# Patient Record
Sex: Female | Born: 1975 | Race: Black or African American | Hispanic: No | Marital: Married | Smoking: Never smoker
Health system: Southern US, Community
[De-identification: ages and names within clinical notes are randomized; demographics above are authoritative.]

## PROBLEM LIST (undated history)

## (undated) DIAGNOSIS — R102 Pelvic and perineal pain: Secondary | ICD-10-CM

## (undated) DIAGNOSIS — D649 Anemia, unspecified: Secondary | ICD-10-CM

## (undated) DIAGNOSIS — E785 Hyperlipidemia, unspecified: Secondary | ICD-10-CM

## (undated) DIAGNOSIS — T7840XA Allergy, unspecified, initial encounter: Secondary | ICD-10-CM

## (undated) HISTORY — DX: Hyperlipidemia, unspecified: E78.5

## (undated) HISTORY — DX: Allergy, unspecified, initial encounter: T78.40XA

## (undated) HISTORY — PX: ABDOMINAL HYSTERECTOMY: SHX81

## (undated) HISTORY — DX: Anemia, unspecified: D64.9

---

## 2011-11-23 ENCOUNTER — Telehealth: Payer: Self-pay | Admitting: Hematology and Oncology

## 2011-11-23 NOTE — Telephone Encounter (Signed)
S/w the pt and she is aware of her new pt appts on 12/14/2011. Date and time was at the pt's request

## 2011-11-30 ENCOUNTER — Ambulatory Visit: Payer: Self-pay | Admitting: Hematology and Oncology

## 2011-11-30 ENCOUNTER — Other Ambulatory Visit: Payer: Self-pay

## 2011-11-30 ENCOUNTER — Other Ambulatory Visit: Payer: Self-pay | Admitting: Lab

## 2011-11-30 ENCOUNTER — Ambulatory Visit: Payer: Self-pay

## 2011-12-07 ENCOUNTER — Ambulatory Visit: Payer: Self-pay | Admitting: Hematology and Oncology

## 2011-12-07 ENCOUNTER — Other Ambulatory Visit: Payer: Self-pay | Admitting: Lab

## 2011-12-10 ENCOUNTER — Encounter: Payer: Self-pay | Admitting: *Deleted

## 2011-12-14 ENCOUNTER — Ambulatory Visit: Payer: Self-pay | Admitting: Hematology and Oncology

## 2011-12-14 ENCOUNTER — Ambulatory Visit: Payer: Self-pay

## 2014-08-29 ENCOUNTER — Emergency Department (HOSPITAL_COMMUNITY)
Admission: EM | Admit: 2014-08-29 | Discharge: 2014-08-29 | Disposition: A | Discharge status: Home / Self Care | Payer: 59 | Attending: Emergency Medicine | Admitting: Emergency Medicine

## 2014-08-29 ENCOUNTER — Encounter (HOSPITAL_COMMUNITY): Payer: Self-pay | Admitting: Emergency Medicine

## 2014-08-29 ENCOUNTER — Emergency Department (HOSPITAL_COMMUNITY): Payer: 59

## 2014-08-29 DIAGNOSIS — R000 Tachycardia, unspecified: Secondary | ICD-10-CM | POA: Diagnosis not present

## 2014-08-29 DIAGNOSIS — O4401 Placenta previa specified as without hemorrhage, first trimester: Secondary | ICD-10-CM | POA: Insufficient documentation

## 2014-08-29 DIAGNOSIS — Z3A13 13 weeks gestation of pregnancy: Secondary | ICD-10-CM | POA: Insufficient documentation

## 2014-08-29 DIAGNOSIS — O26891 Other specified pregnancy related conditions, first trimester: Secondary | ICD-10-CM | POA: Diagnosis not present

## 2014-08-29 DIAGNOSIS — O209 Hemorrhage in early pregnancy, unspecified: Secondary | ICD-10-CM | POA: Diagnosis not present

## 2014-08-29 DIAGNOSIS — O4400 Placenta previa specified as without hemorrhage, unspecified trimester: Secondary | ICD-10-CM

## 2014-08-29 LAB — URINALYSIS, ROUTINE W REFLEX MICROSCOPIC
Bilirubin Urine: NEGATIVE
GLUCOSE, UA: NEGATIVE mg/dL
Hgb urine dipstick: NEGATIVE
Ketones, ur: NEGATIVE mg/dL
LEUKOCYTES UA: NEGATIVE
Nitrite: NEGATIVE
Protein, ur: NEGATIVE mg/dL
Specific Gravity, Urine: 1.017 (ref 1.005–1.030)
UROBILINOGEN UA: 1 mg/dL (ref 0.0–1.0)
pH: 7 (ref 5.0–8.0)

## 2014-08-29 LAB — CBC WITH DIFFERENTIAL/PLATELET
Basophils Absolute: 0 10*3/uL (ref 0.0–0.1)
Basophils Relative: 0 % (ref 0–1)
EOS ABS: 0.1 10*3/uL (ref 0.0–0.7)
EOS PCT: 1 % (ref 0–5)
HCT: 31.3 % — ABNORMAL LOW (ref 36.0–46.0)
Hemoglobin: 10.5 g/dL — ABNORMAL LOW (ref 12.0–15.0)
Lymphocytes Relative: 11 % — ABNORMAL LOW (ref 12–46)
Lymphs Abs: 1 10*3/uL (ref 0.7–4.0)
MCH: 26 pg (ref 26.0–34.0)
MCHC: 33.5 g/dL (ref 30.0–36.0)
MCV: 77.5 fL — AB (ref 78.0–100.0)
Monocytes Absolute: 0.5 10*3/uL (ref 0.1–1.0)
Monocytes Relative: 6 % (ref 3–12)
Neutro Abs: 7 10*3/uL (ref 1.7–7.7)
Neutrophils Relative %: 82 % — ABNORMAL HIGH (ref 43–77)
PLATELETS: 347 10*3/uL (ref 150–400)
RBC: 4.04 MIL/uL (ref 3.87–5.11)
RDW: 16.4 % — AB (ref 11.5–15.5)
WBC: 8.6 10*3/uL (ref 4.0–10.5)

## 2014-08-29 LAB — WET PREP, GENITAL
Trich, Wet Prep: NONE SEEN
YEAST WET PREP: NONE SEEN

## 2014-08-29 LAB — HCG, QUANTITATIVE, PREGNANCY: HCG, BETA CHAIN, QUANT, S: 77601 m[IU]/mL — AB (ref <5)

## 2014-08-29 LAB — HIV ANTIBODY (ROUTINE TESTING W REFLEX): HIV 1&2 Ab, 4th Generation: NONREACTIVE

## 2014-08-29 LAB — RPR

## 2014-08-29 LAB — ABO/RH: ABO/RH(D): A POS

## 2014-08-29 MED ORDER — SODIUM CHLORIDE 0.9 % IV SOLN: 250.0000 mL | INTRAVENOUS | Status: DC | PRN

## 2014-08-29 MED ORDER — SODIUM CHLORIDE 0.9 % IJ SOLN: 3.0000 mL | Freq: Two times a day (BID) | INTRAMUSCULAR | Status: DC

## 2014-08-29 MED ORDER — SODIUM CHLORIDE 0.9 % IJ SOLN: 3.0000 mL | INTRAMUSCULAR | Status: DC | PRN

## 2014-08-29 NOTE — ED Provider Notes (Signed)
CSN: [id number]     Arrival date & time 08/29/14  0426 History   First MD Initiated Contact with Patient 08/29/14 [phone]     Chief Complaint  Patient presents with  . Vaginal Bleeding     (Consider location/radiation/quality/duration/timing/severity/associated sxs/prior Treatment) HPI 38 year old female presents to emergency room with complaint of vaginal bleeding.  Patient estimates that she is about [redacted] weeks pregnant.  She thinks that her last menstrual cycle was the end of June.  Using a [place] date of June 25, pregnancy wheel puts her at roughly [redacted] weeks pregnant.  Patient is a G4 P3.  She reports 3 prior C-sections.  No prior problems during pregnancy.  She has not prenatal vitamins.  Patient has not yet established OB care, has appointment next week.  She denies any pain.  She estimates amount of bleeding about like a period History reviewed. No pertinent past medical history. History reviewed. No pertinent past surgical history. No family history on file. History  Substance Use Topics  . Smoking status: Never Smoker   . Smokeless tobacco: Not on file  . Alcohol Use: No   OB History   Grav Para Term Preterm Abortions TAB SAB Ect Mult Living   1              Review of Systems   See History of Present Illness; otherwise all other systems are reviewed and negative  Allergies  Review of patient's allergies indicates no known allergies.  Home Medications   Prior to Admission medications   Not on File   BP 125/76  Pulse 96  Temp(Src) 98.2 F (36.8 C)  Resp 22  Ht 5' 1.5" (1.562 m)  Wt 165 lb (74.844 kg)  BMI 30.68 kg/m2  SpO2 99% Physical Exam  Nursing note and vitals reviewed. Constitutional: She is oriented to person, place, and time. She appears well-developed and well-nourished.  HENT:  Head: Normocephalic and atraumatic.  Nose: Nose normal.  Mouth/Throat: Oropharynx is clear and moist.  Eyes: Conjunctivae and EOM are normal. Pupils are equal, round, and  reactive to light.  Neck: Normal range of motion. Neck supple. No JVD present. No tracheal deviation present. No thyromegaly present.  Cardiovascular: Regular rhythm, normal heart sounds and intact distal pulses.  Exam reveals no gallop and no friction rub.   No murmur heard. Tachycardia noted  Pulmonary/Chest: Effort normal and breath sounds normal. No stridor. No respiratory distress. She has no wheezes. She has no rales. She exhibits no tenderness.  Abdominal: Soft. Bowel sounds are normal. She exhibits no distension and no mass. There is no tenderness. There is no rebound and no guarding.  Uterus palpated just below the umbilicus nontender  Genitourinary:  External genitalia normal Vagina large blood clot without products of conception noted, no discharge Cervix  normal cervix, os is closed negative for cervical motion tenderness Adnexa palpated, no masses or negative for tenderness noted Bladder palpated negative for tenderness Uterus palpated gravid, no masses or negative for tenderness    Musculoskeletal: Normal range of motion. She exhibits no edema and no tenderness.  Lymphadenopathy:    She has no cervical adenopathy.  Neurological: She is alert and oriented to person, place, and time. She displays normal reflexes. She exhibits normal muscle tone. Coordination normal.  Skin: Skin is warm and dry. No rash noted. No erythema. No pallor.  Psychiatric: She has a normal mood and affect. Her behavior is normal. Judgment and thought content normal.    ED Course  Procedures (  including critical care time) Labs Review Labs Reviewed  WET PREP, GENITAL - Abnormal; Notable for the following:    Clue Cells Wet Prep HPF POC FEW (*)    WBC, Wet Prep HPF POC FEW (*)    All other components within normal limits  HCG, QUANTITATIVE, PREGNANCY - Abnormal; Notable for the following:    hCG, Beta Chain, Quant, S [id number] (*)    All other components within normal limits  CBC WITH DIFFERENTIAL -  Abnormal; Notable for the following:    Hemoglobin 10.5 (*)    HCT 31.3 (*)    MCV 77.5 (*)    RDW 16.4 (*)    Neutrophils Relative % 82 (*)    Lymphocytes Relative 11 (*)    All other components within normal limits  GC/CHLAMYDIA PROBE AMP  URINALYSIS, ROUTINE W REFLEX MICROSCOPIC  RPR  HIV ANTIBODY (ROUTINE TESTING)  ABO/RH    Imaging Review [place] Ob Comp Less 14 Wks  08/29/2014   CLINICAL DATA:  Bleeding.  Unknown LMP, probably June.  EXAM: LIMITED OBSTETRIC ULTRASOUND  FINDINGS: Number of Fetuses: 1  Heart Rate:  165 bpm  Movement: Fetal movement is observed.  Presentation: Variable transverse presentation.  Placental Location: Anterior.  Previa: Complete previa is suggested.  Amniotic Fluid (Subjective):  Within normal limits.  BPD:  2.3cm 13w  6d  MATERNAL FINDINGS:  Cervix: Cervix is closed. Cervical length measures about 4.2 cm. Small nabothian cysts in the cervix.  Uterus/Adnexae: Limited visualization of the uterus demonstrates no a mass lesion. Ovaries are not visualized.  IMPRESSION: A single intrauterine pregnancy is demonstrated. Fetal movement and cardiac activity are observed. The placenta is anterior with complete placenta previa suggested. Cervix is closed.  This exam is performed on an emergent basis and does not comprehensively evaluate fetal size, dating, or anatomy; follow-up complete OB US should be considered if further fetal assessment is warranted.   Electronically Signed   By: Burman NievesWilliam  Stevens M.D.   On: 08/29/2014 06:22   [place] Ob Transvaginal  08/29/2014   CLINICAL DATA:  Bleeding.  Unknown LMP, probably June.  EXAM: LIMITED OBSTETRIC ULTRASOUND  FINDINGS: Number of Fetuses: 1  Heart Rate:  165 bpm  Movement: Fetal movement is observed.  Presentation: Variable transverse presentation.  Placental Location: Anterior.  Previa: Complete previa is suggested.  Amniotic Fluid (Subjective):  Within normal limits.  BPD:  2.3cm 13w  6d  MATERNAL FINDINGS:  Cervix: Cervix is closed.  Cervical length measures about 4.2 cm. Small nabothian cysts in the cervix.  Uterus/Adnexae: Limited visualization of the uterus demonstrates no a mass lesion. Ovaries are not visualized.  IMPRESSION: A single intrauterine pregnancy is demonstrated. Fetal movement and cardiac activity are observed. The placenta is anterior with complete placenta previa suggested. Cervix is closed.  This exam is performed on an emergent basis and does not comprehensively evaluate fetal size, dating, or anatomy; follow-up complete OB US should be considered if further fetal assessment is warranted.   Electronically Signed   By: Burman NievesWilliam  Stevens M.D.   On: 08/29/2014 06:22     EKG Interpretation None      MDM   Final diagnoses:  Vaginal bleeding in pregnant patient at less than [redacted] weeks gestation  Placenta previa antepartum, unspecified trimester    38 year old female with vaginal bleeding in possibly early second trimester without prenatal care to this point.  Os is closed, there is some blood in the vagina.  Plan for ultrasound, labs.  7:38 AM Placenta  previa noted.  D/w Dr Francesco Runner who recommends close f/u with ob this week.  Pt advised about pelvic rest, close f/u needed and with worsening sxs will need to go to [facility], MD 08/29/14 [phone]

## 2014-08-29 NOTE — Discharge Instructions (Signed)
You need to followup with an obstetrician this week for recheck.  No douching, tampons or intercourse until seen by an obstetrician.  If you have further vaginal bleeding cramping or other new symptoms, [facility].   Placenta Previa  Placenta previa is a condition in pregnant women where the placenta implants in the lower part of the uterus. The placenta either partially or completely covers the opening to the cervix. This is a problem because the baby must pass through the cervix during delivery. There are three types of placenta previa. They include:  1. Marginal placenta previa. The placenta is near the cervix, but does not cover the opening. 2. Partial placenta previa. The placenta covers part of the cervical opening. 3. Complete placenta previa. The placenta covers the entire cervical opening.  Depending on the type of placenta previa, there is a chance the placenta may move into a normal position and no longer cover the cervix as the pregnancy progresses. It is important to keep all prenatal visits with your caregiver.  RISK FACTORS You may be more likely to develop placenta previa if you:   Are carrying more than one baby (multiples).   Have an abnormally shaped uterus.   Have scars on the lining of the uterus.   Had previous surgeries involving the uterus, such as a cesarean delivery.   Have delivered a baby previously.   Have a history of placenta previa.   Have smoked or used cocaine during pregnancy.   Are age 39 or older during pregnancy.  SYMPTOMS The main symptom of placenta previa is sudden, painless vaginal bleeding during the second half of pregnancy. The amount of bleeding can be light to very heavy. The bleeding may stop on its own, but almost always returns. Cramping, regular contractions, abdominal pain, and lower back pain can also occur with placenta previa.  DIAGNOSIS Placenta previa can be diagnosed through an  ultrasound by finding where the placenta is located. The ultrasound may find placenta previa either during a routine prenatal visit or after vaginal bleeding is noticed. If you are diagnosed with placenta previa, your caregiver may avoid vaginal exams to reduce the risk of heavy bleeding. There is a chance that placenta previa may not be diagnosed until bleeding occurs during labor.  TREATMENT Specific treatment depends on:   How much you are bleeding or if the bleeding has stopped.  How far along you are in your pregnancy.   The condition of the baby.   The location of the baby and placenta.   The type of placenta previa.  Depending on the factors above, your caregiver may recommend:   Decreased activity.   Bed rest at home or in the hospital.  Pelvic rest. This means no sex, using tampons, douching, pelvic exams, or placing anything into the vagina.  A blood transfusion to replace maternal blood loss.  A cesarean delivery if the bleeding is heavy and cannot be controlled or the placenta completely covers the cervix.  Medication to stop premature labor or mature the fetal lungs if delivery is needed before the pregnancy is full term.  WHEN SHOULD YOU SEEK IMMEDIATE MEDICAL CARE IF YOU ARE SENT HOME WITH PLACENTA PREVIA? Seek immediate medical care if you show any symptoms of placenta previa. You will need to go to the hospital to get checked immediately. Again, those symptoms are:  Sudden, painless vaginal bleeding, even a small amount.  Cramping or regular contractions.  Lower back or  abdominal pain. Document Released: 11/12/2005 Document Revised: 07/15/2013 Document Reviewed: 02/13/2013 [facility] Patient Information 2015 [place], [place]. This information is not intended to replace advice given to you by your health care provider. Make sure you discuss any questions you have with your health care provider.  Pelvic Rest Pelvic rest is sometimes recommended for women when:     The placenta is partially or completely covering the opening of the cervix (placenta previa).  There is bleeding between the uterine wall and the amniotic sac in the first trimester (subchorionic hemorrhage).  The cervix begins to open without labor starting (incompetent cervix, cervical insufficiency).  The labor is too early (preterm labor). HOME CARE INSTRUCTIONS  Do not have sexual intercourse, stimulation, or an orgasm.  Do not use tampons, douche, or put anything in the vagina.  Do not lift anything over 10 pounds (4.5 kg).  Avoid strenuous activity or straining your pelvic muscles. SEEK MEDICAL CARE IF:  You have any vaginal bleeding during pregnancy. Treat this as a potential emergency.  You have cramping pain felt low in the stomach (stronger than menstrual cramps).  You notice vaginal discharge (watery, mucus, or bloody).  You have a low, dull backache.  There are regular contractions or uterine tightening. SEEK IMMEDIATE MEDICAL CARE IF: You have vaginal bleeding and have placenta previa.  Document Released: 03/09/2011 Document Revised: 02/04/2012 Document Reviewed: 03/09/2011 [facility] Patient Information 2015 [place], [place]. This information is not intended to replace advice given to you by your health care provider. Make sure you discuss any questions you have with your health care provider.  Vaginal Bleeding During Pregnancy, Second Trimester A small amount of bleeding (spotting) from the vagina is relatively common in pregnancy. It usually stops on its own. Various things can cause bleeding or spotting in pregnancy. Some bleeding may be related to the pregnancy, and some may not. Sometimes the bleeding is normal and is not a problem. However, bleeding can also be a sign of something serious. Be sure to tell your health care provider about any vaginal bleeding right away. Some possible causes of vaginal bleeding during the second trimester include:  Infection,  inflammation, or growths on the cervix.   The placenta may be partially or completely covering the opening of the cervix inside the uterus (placenta previa).  The placenta may have separated from the uterus (abruption of the placenta).   You may be having early (preterm) labor.   The cervix may not be strong enough to keep a baby inside the uterus (cervical insufficiency).   Tiny cysts may have developed in the uterus instead of pregnancy tissue (molar pregnancy). HOME CARE INSTRUCTIONS  Watch your condition for any changes. The following actions may help to lessen any discomfort you are feeling:  Follow your health care provider's instructions for limiting your activity. If your health care provider orders bed rest, you may need to stay in bed and only get up to use the bathroom. However, your health care provider may allow you to continue light activity.  If needed, make plans for someone to help with your regular activities and responsibilities while you are on bed rest.  Keep track of the number of pads you use each day, how often you change pads, and how soaked (saturated) they are. Write this down.  Do not use tampons. Do not douche.  Do not have sexual intercourse or orgasms until approved by your health care provider.  If you pass any tissue from your vagina, save the tissue  so you can show it to your health care provider.  Only take over-the-counter or prescription medicines as directed by your health care provider.  Do not take aspirin because it can make you bleed.  Do not exercise or perform any strenuous activities or heavy lifting without your health care provider's permission.  Keep all follow-up appointments as directed by your health care provider. SEEK MEDICAL CARE IF:  You have any vaginal bleeding during any part of your pregnancy.  You have cramps or labor pains.  You have a fever, not controlled by medicine. SEEK IMMEDIATE MEDICAL CARE IF:   You  have severe cramps in your back or belly (abdomen).  You have contractions.  You have chills.  You pass large clots or tissue from your vagina.  Your bleeding increases.  You feel light-headed or weak, or you have fainting episodes.  You are leaking fluid or have a gush of fluid from your vagina. MAKE SURE YOU:  Understand these instructions.  Will watch your condition.  Will get help right away if you are not doing well or get worse. Document Released: 08/22/2005 Document Revised: 11/17/2013 Document Reviewed: 07/20/2013 [facility] Patient Information 2015 [place], [place]. This information is not intended to replace advice given to you by your health care provider. Make sure you discuss any questions you have with your health care provider.

## 2014-08-29 NOTE — ED Notes (Signed)
The pt is approximately  12-[redacted] weeks pregnant.  She has not seen an ob doctor yet.  Her appointment is next week.  No pain anywhere.  She started having vaginal bleeding heavy one hour ago.  lmp June.   She is unsure of her edc.  This is her 4th pregnancy

## 2014-08-30 LAB — GC/CHLAMYDIA PROBE AMP
CT Probe RNA: NEGATIVE
GC Probe RNA: NEGATIVE

## 2014-09-28 ENCOUNTER — Encounter (HOSPITAL_COMMUNITY): Payer: Self-pay | Admitting: Emergency Medicine

## 2014-12-01 ENCOUNTER — Ambulatory Visit (INDEPENDENT_AMBULATORY_CARE_PROVIDER_SITE_OTHER): Payer: 59 | Admitting: Advanced Practice Midwife

## 2014-12-01 ENCOUNTER — Encounter: Payer: Self-pay | Admitting: Advanced Practice Midwife

## 2014-12-01 VITALS — BP 102/65 | HR 87 | Wt 177.2 lb

## 2014-12-01 DIAGNOSIS — Z3482 Encounter for supervision of other normal pregnancy, second trimester: Secondary | ICD-10-CM

## 2014-12-01 DIAGNOSIS — O0973 Supervision of high risk pregnancy due to social problems, third trimester: Secondary | ICD-10-CM | POA: Insufficient documentation

## 2014-12-01 DIAGNOSIS — Z118 Encounter for screening for other infectious and parasitic diseases: Secondary | ICD-10-CM

## 2014-12-01 DIAGNOSIS — Z3492 Encounter for supervision of normal pregnancy, unspecified, second trimester: Secondary | ICD-10-CM | POA: Insufficient documentation

## 2014-12-01 DIAGNOSIS — O0932 Supervision of pregnancy with insufficient antenatal care, second trimester: Secondary | ICD-10-CM

## 2014-12-01 DIAGNOSIS — O3421 Maternal care for scar from previous cesarean delivery: Secondary | ICD-10-CM

## 2014-12-01 DIAGNOSIS — Z113 Encounter for screening for infections with a predominantly sexual mode of transmission: Secondary | ICD-10-CM

## 2014-12-01 DIAGNOSIS — O09529 Supervision of elderly multigravida, unspecified trimester: Secondary | ICD-10-CM

## 2014-12-01 DIAGNOSIS — Z23 Encounter for immunization: Secondary | ICD-10-CM

## 2014-12-01 DIAGNOSIS — Z124 Encounter for screening for malignant neoplasm of cervix: Secondary | ICD-10-CM

## 2014-12-01 DIAGNOSIS — Z1151 Encounter for screening for human papillomavirus (HPV): Secondary | ICD-10-CM

## 2014-12-01 LAB — POCT URINALYSIS DIP (DEVICE)
Bilirubin Urine: NEGATIVE
GLUCOSE, UA: NEGATIVE mg/dL
HGB URINE DIPSTICK: NEGATIVE
Ketones, ur: 15 mg/dL — AB
Leukocytes, UA: NEGATIVE
Nitrite: NEGATIVE
PH: 7 (ref 5.0–8.0)
PROTEIN: NEGATIVE mg/dL
Specific Gravity, Urine: 1.02 (ref 1.005–1.030)
Urobilinogen, UA: 0.2 mg/dL (ref 0.0–1.0)

## 2014-12-01 MED ORDER — TETANUS-DIPHTH-ACELL PERTUSSIS 5-2.5-18.5 LF-MCG/0.5 IM SUSP: 0.5000 mL | Freq: Once | INTRAMUSCULAR | Status: DC

## 2014-12-01 NOTE — Patient Instructions (Signed)
Influenza Virus Vaccine (Flucelvax) What is this medicine? INFLUENZA VIRUS VACCINE (in floo EN zuh VAHY ruhs vak SEEN) helps to reduce the risk of getting influenza also known as the flu. The vaccine only helps protect you against some strains of the flu. This medicine may be used for other purposes; ask your health care provider or pharmacist if you have questions. COMMON BRAND NAME(S): FLUCELVAX What should I tell my health care provider before I take this medicine? They need to know if you have any of these conditions: -bleeding disorder like hemophilia -fever or infection -Guillain-Barre syndrome or other neurological problems -immune system problems -infection with the human immunodeficiency virus (HIV) or AIDS -low blood platelet counts -multiple sclerosis -an unusual or allergic reaction to influenza virus vaccine, other medicines, foods, dyes or preservatives -pregnant or trying to get pregnant -breast-feeding How should I use this medicine? This vaccine is for injection into a muscle. It is given by a health care professional. A copy of Vaccine Information Statements will be given before each vaccination. Read this sheet carefully each time. The sheet may change frequently. Talk to your pediatrician regarding the use of this medicine in children. Special care may be needed. Overdosage: If you think you've taken too much of this medicine contact a poison control center or emergency room at once. Overdosage: If you think you have taken too much of this medicine contact a poison control center or emergency room at once. NOTE: This medicine is only for you. Do not share this medicine with others. What if I miss a dose? This does not apply. What may interact with this medicine? -chemotherapy or radiation therapy -medicines that lower your immune system like etanercept, anakinra, infliximab, and adalimumab -medicines that treat or prevent blood clots like  warfarin -phenytoin -steroid medicines like prednisone or cortisone -theophylline -vaccines This list may not describe all possible interactions. Give your health care provider a list of all the medicines, herbs, non-prescription drugs, or dietary supplements you use. Also tell them if you smoke, drink alcohol, or use illegal drugs. Some items may interact with your medicine. What should I watch for while using this medicine? Report any side effects that do not go away within 3 days to your doctor or health care professional. Call your health care provider if any unusual symptoms occur within 6 weeks of receiving this vaccine. You may still catch the flu, but the illness is not usually as bad. You cannot get the flu from the vaccine. The vaccine will not protect against colds or other illnesses that may cause fever. The vaccine is needed every year. What side effects may I notice from receiving this medicine? Side effects that you should report to your doctor or health care professional as soon as possible: -allergic reactions like skin rash, itching or hives, swelling of the face, lips, or tongue Side effects that usually do not require medical attention (Report these to your doctor or health care professional if they continue or are bothersome.): -fever -headache -muscle aches and pains -pain, tenderness, redness, or swelling at the injection site -tiredness This list may not describe all possible side effects. Call your doctor for medical advice about side effects. You may report side effects to FDA at 1-800-FDA-1088. Where should I keep my medicine? The vaccine will be given by a health care professional in a clinic, pharmacy, doctor's office, or other health care setting. You will not be given vaccine doses to store at home. NOTE: This sheet is a summary.   It may not cover all possible information. If you have questions about this medicine, talk to your doctor, pharmacist, or health care  provider.  2015, [employer]vier/[employer] Standard. (2011-10-24 14:06:47)  Preterm Labor Information Preterm labor is when labor starts at less than 37 weeks of pregnancy. The normal length of a pregnancy is 39 to 41 weeks. CAUSES Often, there is no identifiable underlying cause as to why a woman goes into preterm labor. One of the most common known causes of preterm labor is infection. Infections of the uterus, cervix, vagina, amniotic sac, bladder, kidney, or even the lungs (pneumonia) can cause labor to start. Other suspected causes of preterm labor include:   Urogenital infections, such as yeast infections and bacterial vaginosis.   Uterine abnormalities (uterine shape, uterine septum, fibroids, or bleeding from the placenta).   A cervix that has been operated on (it may fail to stay closed).   Malformations in the fetus.   Multiple gestations (twins, triplets, and so on).   Breakage of the amniotic sac.  RISK FACTORS  Having a previous history of preterm labor.   Having premature rupture of membranes (PROM).   Having a placenta that covers the opening of the cervix (placenta previa).   Having a placenta that separates from the uterus (placental abruption).   Having a cervix that is too weak to hold the fetus in the uterus (incompetent cervix).   Having too much fluid in the amniotic sac (polyhydramnios).   Taking illegal drugs or smoking while pregnant.   Not gaining enough weight while pregnant.   Being younger than 2918 and older than 40 years old.   Having a low socioeconomic status.   Being African American. SYMPTOMS Signs and symptoms of preterm labor include:   Menstrual-like cramps, abdominal pain, or back pain.  Uterine contractions that are regular, as frequent as six in an hour, regardless of their intensity (may be mild or painful).  Contractions that start on the top of the uterus and spread down to the lower abdomen and back.   A sense of  increased pelvic pressure.   A watery or bloody mucus discharge that comes from the vagina.  TREATMENT Depending on the length of the pregnancy and other circumstances, your health care provider may suggest bed rest. If necessary, there are medicines that can be given to stop contractions and to mature the fetal lungs. If labor happens before 34 weeks of pregnancy, a prolonged hospital stay may be recommended. Treatment depends on the condition of both you and the fetus.  WHAT SHOULD YOU DO IF YOU THINK YOU ARE IN PRETERM LABOR? Call your health care provider right away. You will need to go to the hospital to get checked immediately. HOW CAN YOU PREVENT PRETERM LABOR IN FUTURE PREGNANCIES? You should:   Stop smoking if you smoke.  Maintain healthy weight gain and avoid chemicals and drugs that are not necessary.  Be watchful for any type of infection.  Inform your health care provider if you have a known history of preterm labor. Document Released: 02/02/2004 Document Revised: 07/15/2013 Document Reviewed: 12/15/2012 [facility] Patient Information 2015 [place]. This information is not intended to replace advice given to you by your health care provider. Make sure you discuss any questions you have with your health care provider.

## 2014-12-01 NOTE — Progress Notes (Signed)
Needs pap last done in 2004. Complete previa suspected on early ultrasound. No bleeding recently, did have bleeding in early pregnancy.

## 2014-12-02 ENCOUNTER — Encounter: Payer: Self-pay | Admitting: Advanced Practice Midwife

## 2014-12-02 DIAGNOSIS — Z2839 Other underimmunization status: Secondary | ICD-10-CM | POA: Insufficient documentation

## 2014-12-02 DIAGNOSIS — Z283 Underimmunization status: Secondary | ICD-10-CM | POA: Insufficient documentation

## 2014-12-02 DIAGNOSIS — Z789 Other specified health status: Secondary | ICD-10-CM | POA: Insufficient documentation

## 2014-12-02 DIAGNOSIS — O9989 Other specified diseases and conditions complicating pregnancy, childbirth and the puerperium: Secondary | ICD-10-CM

## 2014-12-02 LAB — PRESCRIPTION MONITORING PROFILE (19 PANEL)
AMPHETAMINE/METH: NEGATIVE ng/mL
Barbiturate Screen, Urine: NEGATIVE ng/mL
Benzodiazepine Screen, Urine: NEGATIVE ng/mL
Buprenorphine, Urine: NEGATIVE ng/mL
CANNABINOID SCRN UR: NEGATIVE ng/mL
CARISOPRODOL, URINE: NEGATIVE ng/mL
COCAINE METABOLITES: NEGATIVE ng/mL
CREATININE, URINE: 157.37 mg/dL (ref >20.0)
Fentanyl, Ur: NEGATIVE ng/mL
MDMA URINE: NEGATIVE ng/mL
MEPERIDINE UR: NEGATIVE ng/mL
METHAQUALONE SCREEN (URINE): NEGATIVE ng/mL
Methadone Screen, Urine: NEGATIVE ng/mL
Nitrites, Initial: NEGATIVE ug/mL
OPIATE SCREEN, URINE: NEGATIVE ng/mL
Oxycodone Screen, Ur: NEGATIVE ng/mL
PROPOXYPHENE: NEGATIVE ng/mL
Phencyclidine, Ur: NEGATIVE ng/mL
TAPENTADOLUR: NEGATIVE ng/mL
Tramadol Scrn, Ur: NEGATIVE ng/mL
Zolpidem, Urine: NEGATIVE ng/mL
pH, Initial: 7 pH (ref 4.5–8.9)

## 2014-12-02 LAB — PRENATAL PROFILE (SOLSTAS)
ANTIBODY SCREEN: NEGATIVE
BASOS ABS: 0 10*3/uL (ref 0.0–0.1)
BASOS PCT: 0 % (ref 0–1)
EOS ABS: 0.1 10*3/uL (ref 0.0–0.7)
EOS PCT: 2 % (ref 0–5)
HEMATOCRIT: 31.6 % — AB (ref 36.0–46.0)
HEP B S AG: NEGATIVE
HIV 1&2 Ab, 4th Generation: NONREACTIVE
Hemoglobin: 10.5 g/dL — ABNORMAL LOW (ref 12.0–15.0)
Lymphocytes Relative: 21 % (ref 12–46)
Lymphs Abs: 1.4 10*3/uL (ref 0.7–4.0)
MCH: 27.3 pg (ref 26.0–34.0)
MCHC: 33.2 g/dL (ref 30.0–36.0)
MCV: 82.1 fL (ref 78.0–100.0)
MPV: 9.7 fL (ref 8.6–12.4)
Monocytes Absolute: 0.3 10*3/uL (ref 0.1–1.0)
Monocytes Relative: 4 % (ref 3–12)
NEUTROS ABS: 4.9 10*3/uL (ref 1.7–7.7)
NEUTROS PCT: 73 % (ref 43–77)
Platelets: 268 10*3/uL (ref 150–400)
RBC: 3.85 MIL/uL — AB (ref 3.87–5.11)
RDW: 15.9 % — ABNORMAL HIGH (ref 11.5–15.5)
Rh Type: POSITIVE
Rubella: 0.74 Index (ref <0.90)
WBC: 6.7 10*3/uL (ref 4.0–10.5)

## 2014-12-02 LAB — CYTOLOGY - PAP

## 2014-12-02 LAB — GLUCOSE TOLERANCE, 1 HOUR (50G) W/O FASTING: GLUCOSE 1 HOUR GTT: 134 mg/dL (ref 70–140)

## 2014-12-03 ENCOUNTER — Encounter: Payer: Self-pay | Admitting: Advanced Practice Midwife

## 2014-12-03 ENCOUNTER — Encounter (HOSPITAL_COMMUNITY): Payer: Self-pay

## 2014-12-03 ENCOUNTER — Ambulatory Visit (HOSPITAL_COMMUNITY)
Admission: RE | Admit: 2014-12-03 | Discharge: 2014-12-03 | Disposition: A | Discharge status: Home / Self Care | Payer: 59 | Source: Ambulatory Visit | Attending: Advanced Practice Midwife | Admitting: Advanced Practice Midwife

## 2014-12-03 DIAGNOSIS — Z3492 Encounter for supervision of normal pregnancy, unspecified, second trimester: Secondary | ICD-10-CM

## 2014-12-03 DIAGNOSIS — Z3689 Encounter for other specified antenatal screening: Secondary | ICD-10-CM | POA: Insufficient documentation

## 2014-12-03 DIAGNOSIS — O0932 Supervision of pregnancy with insufficient antenatal care, second trimester: Secondary | ICD-10-CM | POA: Diagnosis present

## 2014-12-03 DIAGNOSIS — Z3482 Encounter for supervision of other normal pregnancy, second trimester: Secondary | ICD-10-CM | POA: Diagnosis not present

## 2014-12-03 DIAGNOSIS — O09529 Supervision of elderly multigravida, unspecified trimester: Secondary | ICD-10-CM | POA: Insufficient documentation

## 2014-12-03 DIAGNOSIS — O359XX0 Maternal care for (suspected) fetal abnormality and damage, unspecified, not applicable or unspecified: Secondary | ICD-10-CM | POA: Insufficient documentation

## 2014-12-03 DIAGNOSIS — Z3A27 27 weeks gestation of pregnancy: Secondary | ICD-10-CM | POA: Insufficient documentation

## 2014-12-03 LAB — CULTURE, OB URINE: Colony Count: 70000

## 2014-12-03 NOTE — Progress Notes (Signed)
Subjective:    Anne Burton is a [id number] 5862w4d by 13 week US and uncertain LMP being seen today for her first obstetrical visit. No other prenatal Care this pregnancy. Her obstetrical history is significant for advanced maternal age, obesity and Hx C/S x 3, Hx son w/ cardiac defect (?valve too small). Patient does intend to breast feed. Pregnancy history fully reviewed. Possible previa per 13 week US. Had first trimester bleeding for which she was seen in [facility]. None since.Marland Kitchen. Has not had F/U US.   Patient reports no bleeding, no contractions and no leaking.  Filed Vitals:   12/01/14 0927  BP: 102/65  Pulse: 87  Weight: 80.377 kg (177 lb 3.2 oz)    HISTORY: OB History  Gravida Para Term Preterm AB SAB TAB Ectopic Multiple Living  5 3 3  1  1   6     # Outcome Date GA Lbr Len/2nd Weight Sex Delivery Anes PTL Lv  5 Current           4 Term 11/12/03 4411w0d   M CS-Unspec  N Y  3 Term 02/14/96 3511w0d   F CS-Unspec   Y  2 Term 02/04/93 3158w0d   F CS-Unspec  N Y  1 TAB              Past Medical History  Diagnosis Date  . Anemia    Past Surgical History  Procedure Laterality Date  . Cesarean section     History reviewed. No pertinent family history.   Exam    Uterus:   Fundal height 28 cm  Pelvic Exam:    Perineum: No Hemorrhoids   Vulva: normal   Vagina:  normal mucosa   pH: NA   Cervix: multiparous appearance   Adnexa: not evaluated   Bony Pelvis: Unproven. Exam deferred  System: Breast:  normal appearance, no masses or tenderness   Skin: normal coloration and turgor, no rashes    Neurologic: oriented, normal mood, gait normal; reflexes normal and symmetric   Extremities: normal strength, tone, and muscle mass   HEENT sclera clear, anicteric   Mouth/Teeth mucous membranes moist, pharynx normal without lesions and dental hygiene good   Neck supple and no masses   Cardiovascular: regular rate and rhythm, no murmurs or gallops   Respiratory:  appears well, vitals normal,  no respiratory distress, acyanotic, normal RR, chest clear, no wheezing, crepitations, rhonchi, normal symmetric air entry   Abdomen: soft, non-tender; bowel sounds normal; no masses,  no organomegaly and 28 cm fundal height   Urinary: urethral meatus normal      Assessment:   1. Late prenatal care affecting pregnancy in second trimester  - Prenatal Profile - Glucose Tolerance, 1 HR (50g) w/o Fasting - Hemoglobinopathy evaluation - Culture, OB Urine - Prescript Monitor Profile(19) - Tdap (BOOSTRIX) injection 0.5 mL; Inject 0.5 mLs into the muscle once. - Cytology - PAP - US OB Detail; Future - Flu Vaccine QUAD 36+ mos IM; Standing - Flu Vaccine QUAD 36+ mos IM  2. Supervision of normal pregnancy in second trimester   3. Need for influenza vaccination  - Flu Vaccine QUAD 36+ mos IM; Standing - Flu Vaccine QUAD 36+ mos IM    Plan:     Initial labs drawn. Prenatal vitamins. Problem list reviewed and updated. Genetic Screening discussed Quad Screen: too late. Offered NIPS. Will discuss w/ MFM.  Ultrasound discussed; fetal survey: ordered.  Follow up in 2 weeks. 50 % of 30 min visit  spent on counseling and coordination of care.   Discuss need for fetal echo w/ MFM.   Dorathy Kinsman 12/03/2014

## 2014-12-06 LAB — HEMOGLOBINOPATHY EVALUATION
HEMOGLOBIN OTHER: 0 %
HGB S QUANTITAION: 0 %
Hgb A2 Quant: 2.5 % (ref 2.2–3.2)
Hgb A: 97.2 % (ref 96.8–97.8)
Hgb F Quant: 0.3 % (ref 0.0–2.0)

## 2014-12-07 ENCOUNTER — Encounter: Payer: Self-pay | Admitting: Advanced Practice Midwife

## 2014-12-07 DIAGNOSIS — Z98891 History of uterine scar from previous surgery: Secondary | ICD-10-CM | POA: Insufficient documentation

## 2014-12-07 DIAGNOSIS — O34219 Maternal care for unspecified type scar from previous cesarean delivery: Secondary | ICD-10-CM | POA: Insufficient documentation

## 2014-12-07 DIAGNOSIS — O4402 Placenta previa specified as without hemorrhage, second trimester: Secondary | ICD-10-CM | POA: Insufficient documentation

## 2014-12-10 ENCOUNTER — Telehealth (HOSPITAL_COMMUNITY): Payer: Self-pay | Admitting: MS"

## 2014-12-10 ENCOUNTER — Other Ambulatory Visit (HOSPITAL_COMMUNITY): Payer: Self-pay

## 2014-12-10 NOTE — Telephone Encounter (Signed)
Called Anne Burton to discuss her cell free fetal DNA test results.  Ms. Anne Burton had Panorama testing through [facility].  Testing was offered because of advanced maternal age and ultrasound finding of absent nasal bone.   The patient was identified by name and DOB.  We reviewed that these are within normal limits, showing a less than 1 in 10,000 risk for trisomies 21, 18 and 13, and monosomy X (Turner syndrome).  In addition, the risk for triploidy/vanishing twin and sex chromosome trisomies (47,XXX and 47,XXY) was also low risk.  We reviewed that this testing identifies > 99% of pregnancies with trisomy 2621, trisomy 6113, sex chromosome trisomies (47,XXX and 47,XXY), and triploidy. The detection rate for trisomy 18 is 96%.  The detection rate for monosomy X is ~92%.  The false positive rate is <0.1% for all conditions. Testing was also consistent with female fetal sex.  The patient did wish to know fetal sex.  She understands that this testing does not identify all genetic conditions.  All questions were answered to her satisfaction, she was encouraged to call with additional questions or concerns.  Anne PlowmanKaren Leauna Sharber, MS Certified Genetic Counselor 12/10/2014 1:41 PM

## 2014-12-22 ENCOUNTER — Ambulatory Visit (INDEPENDENT_AMBULATORY_CARE_PROVIDER_SITE_OTHER): Payer: 59 | Admitting: Physician Assistant

## 2014-12-22 VITALS — BP 100/55 | HR 95 | Temp 98.7°F | Wt 179.4 lb

## 2014-12-22 DIAGNOSIS — Z3492 Encounter for supervision of normal pregnancy, unspecified, second trimester: Secondary | ICD-10-CM

## 2014-12-22 DIAGNOSIS — O0932 Supervision of pregnancy with insufficient antenatal care, second trimester: Secondary | ICD-10-CM

## 2014-12-22 DIAGNOSIS — Z3493 Encounter for supervision of normal pregnancy, unspecified, third trimester: Secondary | ICD-10-CM

## 2014-12-22 DIAGNOSIS — Z3482 Encounter for supervision of other normal pregnancy, second trimester: Secondary | ICD-10-CM

## 2014-12-22 LAB — POCT URINALYSIS DIP (DEVICE)
BILIRUBIN URINE: NEGATIVE
Glucose, UA: NEGATIVE mg/dL
KETONES UR: NEGATIVE mg/dL
LEUKOCYTES UA: NEGATIVE
NITRITE: NEGATIVE
PH: 6 (ref 5.0–8.0)
PROTEIN: NEGATIVE mg/dL
Specific Gravity, Urine: 1.02 (ref 1.005–1.030)
Urobilinogen, UA: 1 mg/dL (ref 0.0–1.0)

## 2014-12-22 MED ORDER — PRENATAL VITAMINS 28-0.8 MG PO TABS: 1.0000 | ORAL_TABLET | Freq: Every morning | ORAL | Status: DC

## 2014-12-22 NOTE — Patient Instructions (Signed)
Carpal Tunnel Syndrome °The carpal tunnel is a narrow area located on the palm side of your wrist. The tunnel is formed by the wrist bones and ligaments. Nerves, blood vessels, and tendons pass through the carpal tunnel. Repeated wrist motion or certain diseases may cause swelling within the tunnel. This swelling pinches the main nerve in the wrist (median nerve) and causes the painful hand and arm condition called carpal tunnel syndrome. °CAUSES  °· Repeated wrist motions. °· Wrist injuries. °· Certain diseases like arthritis, diabetes, alcoholism, hyperthyroidism, and kidney failure. °· Obesity. °· Pregnancy. °SYMPTOMS  °· A "pins and needles" feeling in your fingers or hand, especially in your thumb, index and middle fingers. °· Tingling or numbness in your fingers or hand. °· An aching feeling in your entire arm, especially when your wrist and elbow are bent for long periods of time. °· Wrist pain that goes up your arm to your shoulder. °· Pain that goes down into your palm or fingers. °· A weak feeling in your hands. °DIAGNOSIS  °Your health care provider will take your history and perform a physical exam. An electromyography test may be needed. This test measures electrical signals sent out by your nerves into the muscles. The electrical signals are usually slowed by carpal tunnel syndrome. You may also need X-rays. °TREATMENT  °Carpal tunnel syndrome may clear up by itself. Your health care provider may recommend a wrist splint or medicine such as a nonsteroidal anti-inflammatory medicine. Cortisone injections may help. Sometimes, surgery may be needed to free the pinched nerve.  °HOME CARE INSTRUCTIONS  °· Take all medicine as directed by your health care provider. Only take over-the-counter or prescription medicines for pain, discomfort, or fever as directed by your health care provider. °· If you were given a splint to keep your wrist from bending, wear it as directed. It is important to wear the splint at  night. Wear the splint for as long as you have pain or numbness in your hand, arm, or wrist. This may take 1 to 2 months. °· Rest your wrist from any activity that may be causing your pain. If your symptoms are work-related, you may need to talk to your employer about changing to a job that does not require using your wrist. °· Put ice on your wrist after long periods of wrist activity. °¨ Put ice in a plastic bag. °¨ Place a towel between your skin and the bag. °¨ Leave the ice on for 15-20 minutes, 03-04 times a day. °· Keep all follow-up visits as directed by your health care provider. This includes any orthopedic referrals, physical therapy, and rehabilitation. Any delay in getting necessary care could result in a delay or failure of your condition to heal. °SEEK IMMEDIATE MEDICAL CARE IF:  °· You have new, unexplained symptoms. °· Your symptoms get worse and are not helped or controlled with medicines. °MAKE SURE YOU:  °· Understand these instructions. °· Will watch your condition. °· Will get help right away if you are not doing well or get worse. °Document Released: 11/09/2000 Document Revised: 03/29/2014 Document Reviewed: 09/28/2011 °ExitCare® Patient Information ©2015 [employer]. This information is not intended to replace advice given to you by your health care provider. Make sure you discuss any questions you have with your health care provider. °Braxton Hicks Contractions °Contractions of the uterus can occur throughout pregnancy. Contractions are not always a sign that you are in labor.  °WHAT ARE BRAXTON HICKS CONTRACTIONS?  °Contractions that occur before   labor are called Braxton Hicks contractions, or false labor. Toward the end of pregnancy (32-34 weeks), these contractions can develop more often and may become more forceful. This is not true labor because these contractions do not result in opening (dilatation) and thinning of the cervix. They are sometimes difficult to tell apart from true  labor because these contractions can be forceful and people have different pain tolerances. You should not feel embarrassed if you go to the hospital with false labor. Sometimes, the only way to tell if you are in true labor is for your health care provider to look for changes in the cervix. °If there are no prenatal problems or other health problems associated with the pregnancy, it is completely safe to be sent home with false labor and await the onset of true labor. °HOW CAN YOU TELL THE DIFFERENCE BETWEEN TRUE AND FALSE LABOR? °False Labor °· The contractions of false labor are usually shorter and not as hard as those of true labor.   °· The contractions are usually irregular.   °· The contractions are often felt in the front of the lower abdomen and in the groin.   °· The contractions may go away when you walk around or change positions while lying down.   °· The contractions get weaker and are shorter lasting as time goes on.   °· The contractions do not usually become progressively stronger, regular, and closer together as with true labor.   °True Labor °· Contractions in true labor last 30-70 seconds, become very regular, usually become more intense, and increase in frequency.   °· The contractions do not go away with walking.   °· The discomfort is usually felt in the top of the uterus and spreads to the lower abdomen and low back.   °· True labor can be determined by your health care provider with an exam. This will show that the cervix is dilating and getting thinner.   °WHAT TO REMEMBER °· Keep up with your usual exercises and follow other instructions given by your health care provider.   °· Take medicines as directed by your health care provider.   °· Keep your regular prenatal appointments.   °· Eat and drink lightly if you think you are going into labor.   °· If Braxton Hicks contractions are making you uncomfortable:   °¨ Change your position from lying down or resting to walking, or from walking to  resting.   °¨ Sit and rest in a tub of warm water.   °¨ Drink 2-3 glasses of water. Dehydration may cause these contractions.   °¨ Do slow and deep breathing several times an hour.   °WHEN SHOULD I SEEK IMMEDIATE MEDICAL CARE? °Seek immediate medical care if: °· Your contractions become stronger, more regular, and closer together.   °· You have fluid leaking or gushing from your vagina.   °· You have a fever.   °· You pass blood-tinged mucus.   °· You have vaginal bleeding.   °· You have continuous abdominal pain.   °· You have low back pain that you never had before.   °· You feel your baby's head pushing down and causing pelvic pressure.   °· Your baby is not moving as much as it used to.   °Document Released: 11/12/2005 Document Revised: 11/17/2013 Document Reviewed: 08/24/2013 °ExitCare® Patient Information ©2015 [employer]. This information is not intended to replace advice given to you by your health care provider. Make sure you discuss any questions you have with your health care provider. ° °

## 2014-12-22 NOTE — Progress Notes (Signed)
C/o pain /numbness in both wrists.

## 2014-12-22 NOTE — Progress Notes (Signed)
30 weeks, stable.  Denies LOF, vag bleeding, dysuria.  Endorses good fetal movement.  Complains of wrist pain and numbness bilat throughout the day, awakens from sleep.  Advise wrist splints.   PNV qd. RTC 2 weeks.

## 2014-12-24 LAB — CULTURE, OB URINE: Colony Count: 60000

## 2015-01-03 ENCOUNTER — Telehealth: Payer: Self-pay | Admitting: *Deleted

## 2015-01-03 DIAGNOSIS — Z3493 Encounter for supervision of normal pregnancy, unspecified, third trimester: Secondary | ICD-10-CM

## 2015-01-03 MED ORDER — PRENATAL VITAMINS PLUS 27-1 MG PO TABS: 1.0000 | ORAL_TABLET | Freq: Every day | ORAL | Status: DC

## 2015-01-03 NOTE — Telephone Encounter (Signed)
[facility] prescription sent in for her prenatal vitamin on 12/22/14- they do not carry the strength ordered of PNV 28mg  iron-.08mg  folic acid and want to make a substitution. Called [facility] and approved substitution for PNV plus 27mg - 1mg  which is what we usually prescribe.

## 2015-01-05 ENCOUNTER — Ambulatory Visit (INDEPENDENT_AMBULATORY_CARE_PROVIDER_SITE_OTHER): Payer: 59 | Admitting: Family

## 2015-01-05 VITALS — BP 103/51 | HR 94 | Temp 98.5°F | Wt 178.0 lb

## 2015-01-05 DIAGNOSIS — Z23 Encounter for immunization: Secondary | ICD-10-CM

## 2015-01-05 DIAGNOSIS — Z3492 Encounter for supervision of normal pregnancy, unspecified, second trimester: Secondary | ICD-10-CM

## 2015-01-05 DIAGNOSIS — O4412 Placenta previa with hemorrhage, second trimester: Secondary | ICD-10-CM

## 2015-01-05 DIAGNOSIS — O34219 Maternal care for unspecified type scar from previous cesarean delivery: Secondary | ICD-10-CM

## 2015-01-05 DIAGNOSIS — O359XX1 Maternal care for (suspected) fetal abnormality and damage, unspecified, fetus 1: Secondary | ICD-10-CM

## 2015-01-05 DIAGNOSIS — Z3482 Encounter for supervision of other normal pregnancy, second trimester: Secondary | ICD-10-CM

## 2015-01-05 DIAGNOSIS — O4402 Placenta previa specified as without hemorrhage, second trimester: Secondary | ICD-10-CM

## 2015-01-05 DIAGNOSIS — O3421 Maternal care for scar from previous cesarean delivery: Secondary | ICD-10-CM

## 2015-01-05 LAB — POCT URINALYSIS DIP (DEVICE)
Bilirubin Urine: NEGATIVE
GLUCOSE, UA: NEGATIVE mg/dL
Hgb urine dipstick: NEGATIVE
Nitrite: NEGATIVE
PROTEIN: NEGATIVE mg/dL
SPECIFIC GRAVITY, URINE: 1.02 (ref 1.005–1.030)
UROBILINOGEN UA: 0.2 mg/dL (ref 0.0–1.0)
pH: 6.5 (ref 5.0–8.0)

## 2015-01-05 MED ORDER — TETANUS-DIPHTH-ACELL PERTUSSIS 5-2.5-18.5 LF-MCG/0.5 IM SUSP: 0.5000 mL | Freq: Once | INTRAMUSCULAR | Status: DC

## 2015-01-05 NOTE — Progress Notes (Signed)
C/o of intermittent pelvic pressure and pain in both legs after standing/working on her feet all day.  Reports decrease in appetite.

## 2015-01-05 NOTE — Progress Notes (Signed)
Doing well; scheduled follow-up ultrasound to reassess placenta (ant previa).  Message routed to [place] for repeat csection date.  FMLA paperwork collected and [facility].

## 2015-01-13 ENCOUNTER — Ambulatory Visit (HOSPITAL_COMMUNITY)
Admission: RE | Admit: 2015-01-13 | Discharge: 2015-01-13 | Disposition: A | Discharge status: Home / Self Care | Payer: 59 | Source: Ambulatory Visit | Attending: Family | Admitting: Family

## 2015-01-13 DIAGNOSIS — O09523 Supervision of elderly multigravida, third trimester: Secondary | ICD-10-CM | POA: Insufficient documentation

## 2015-01-13 DIAGNOSIS — O359XX1 Maternal care for (suspected) fetal abnormality and damage, unspecified, fetus 1: Secondary | ICD-10-CM

## 2015-01-13 DIAGNOSIS — Z3A33 33 weeks gestation of pregnancy: Secondary | ICD-10-CM | POA: Diagnosis not present

## 2015-01-13 DIAGNOSIS — O4403 Placenta previa specified as without hemorrhage, third trimester: Secondary | ICD-10-CM | POA: Insufficient documentation

## 2015-01-13 DIAGNOSIS — O34219 Maternal care for unspecified type scar from previous cesarean delivery: Secondary | ICD-10-CM | POA: Insufficient documentation

## 2015-01-13 DIAGNOSIS — O3421 Maternal care for scar from previous cesarean delivery: Secondary | ICD-10-CM | POA: Insufficient documentation

## 2015-01-13 DIAGNOSIS — O0933 Supervision of pregnancy with insufficient antenatal care, third trimester: Secondary | ICD-10-CM | POA: Diagnosis not present

## 2015-01-13 DIAGNOSIS — O358XX0 Maternal care for other (suspected) fetal abnormality and damage, not applicable or unspecified: Secondary | ICD-10-CM | POA: Insufficient documentation

## 2015-01-16 ENCOUNTER — Encounter: Payer: Self-pay | Admitting: Advanced Practice Midwife

## 2015-01-16 DIAGNOSIS — O4393 Unspecified placental disorder, third trimester: Secondary | ICD-10-CM | POA: Insufficient documentation

## 2015-01-18 DIAGNOSIS — O09529 Supervision of elderly multigravida, unspecified trimester: Secondary | ICD-10-CM | POA: Insufficient documentation

## 2015-01-18 DIAGNOSIS — O34219 Maternal care for unspecified type scar from previous cesarean delivery: Secondary | ICD-10-CM | POA: Insufficient documentation

## 2015-01-19 ENCOUNTER — Ambulatory Visit (HOSPITAL_COMMUNITY)
Admission: RE | Admit: 2015-01-19 | Discharge: 2015-01-19 | Disposition: A | Discharge status: Home / Self Care | Payer: 59 | Source: Ambulatory Visit | Attending: Maternal and Fetal Medicine | Admitting: Maternal and Fetal Medicine

## 2015-01-19 ENCOUNTER — Encounter: Payer: 59 | Admitting: Certified Nurse Midwife

## 2015-01-19 DIAGNOSIS — O358XX0 Maternal care for other (suspected) fetal abnormality and damage, not applicable or unspecified: Secondary | ICD-10-CM | POA: Insufficient documentation

## 2015-01-19 DIAGNOSIS — O09523 Supervision of elderly multigravida, third trimester: Secondary | ICD-10-CM | POA: Diagnosis not present

## 2015-01-19 DIAGNOSIS — Z3A33 33 weeks gestation of pregnancy: Secondary | ICD-10-CM | POA: Diagnosis not present

## 2015-01-19 MED ORDER — BETAMETHASONE SOD PHOS & ACET 6 (3-3) MG/ML IJ SUSP
12.0000 mg | Freq: Once | INTRAMUSCULAR | Status: AC
  Administered 2015-01-19: 12 mg via INTRAMUSCULAR
  Filled 2015-01-19: qty 2

## 2015-02-02 ENCOUNTER — Encounter (HOSPITAL_COMMUNITY): Payer: Self-pay

## 2015-02-08 ENCOUNTER — Other Ambulatory Visit: Payer: Self-pay | Admitting: Obstetrics & Gynecology

## 2015-02-10 DIAGNOSIS — Z90710 Acquired absence of both cervix and uterus: Secondary | ICD-10-CM | POA: Insufficient documentation

## 2015-02-21 ENCOUNTER — Encounter (HOSPITAL_COMMUNITY): Admission: RE | Payer: Self-pay | Source: Ambulatory Visit

## 2015-02-21 ENCOUNTER — Inpatient Hospital Stay (HOSPITAL_COMMUNITY): Admission: RE | Admit: 2015-02-21 | Payer: 59 | Source: Ambulatory Visit | Admitting: Obstetrics & Gynecology

## 2015-02-21 SURGERY — Surgical Case
Anesthesia: Regional | Site: Abdomen

## 2015-10-06 ENCOUNTER — Encounter (HOSPITAL_COMMUNITY): Payer: Self-pay | Admitting: *Deleted

## 2015-10-08 IMAGING — US US OB DETAIL+14 WK
1 series · 12 of 28 positions shown · non-contrast
Comparison: none

[Series 1: us ob detail+14 wk · 0.30mm/px · 12 of 71 slices shown]
[im 3/71]
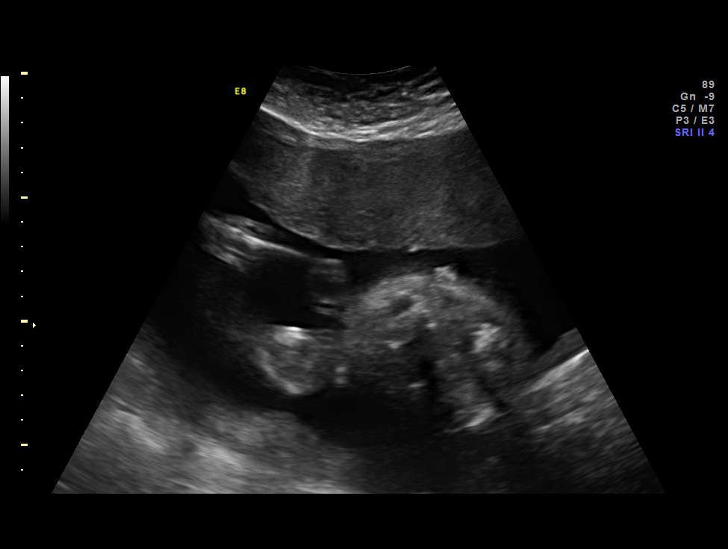
[im 8/71]
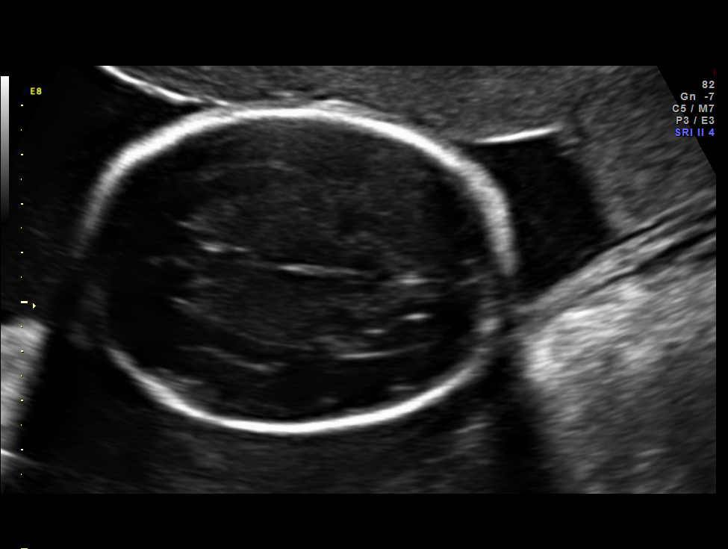
[im 13/71]
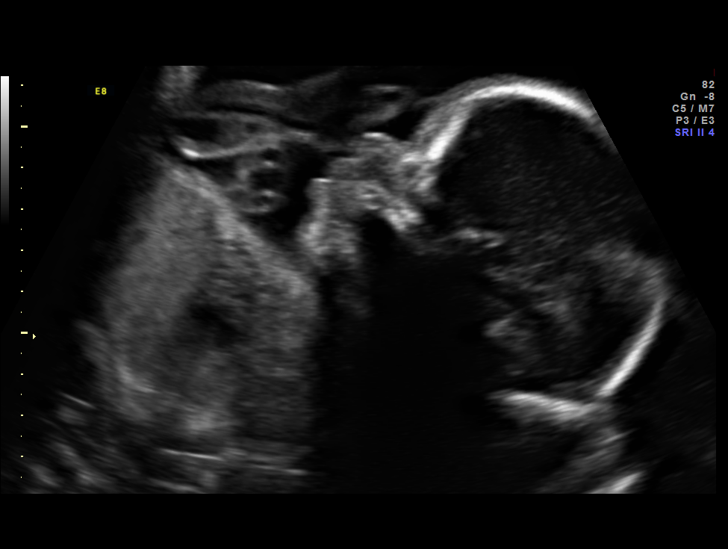
[im 21/71]
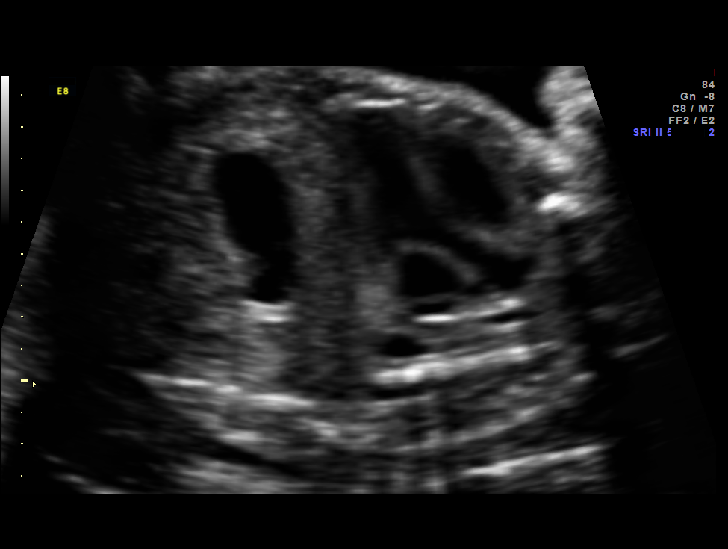
[im 26/71]
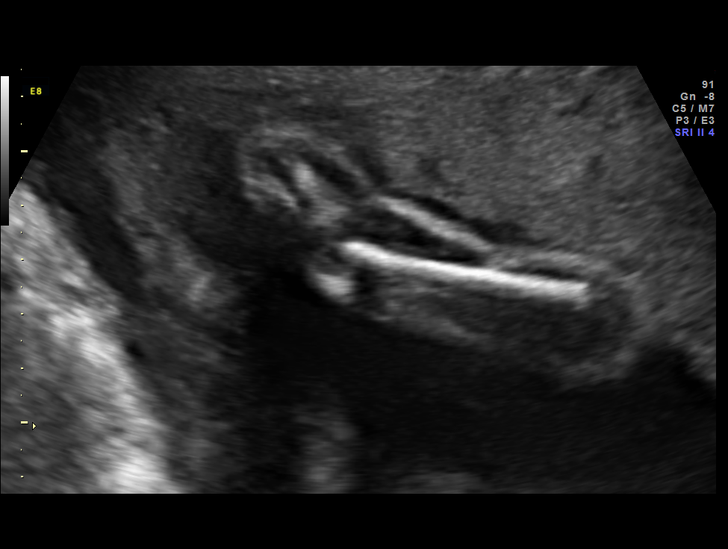
[im 32/71]
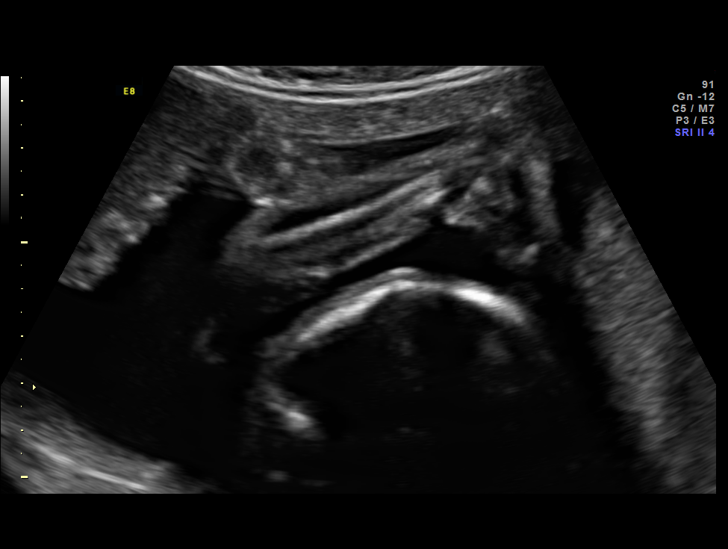
[im 39/71]
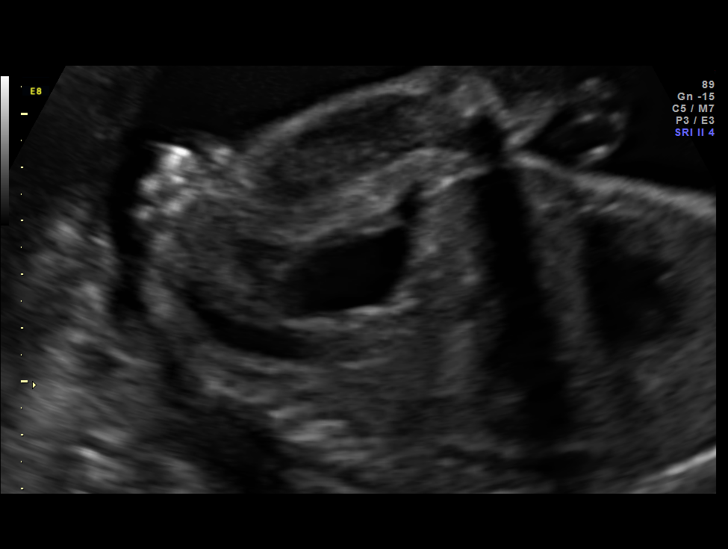
[im 45/71]
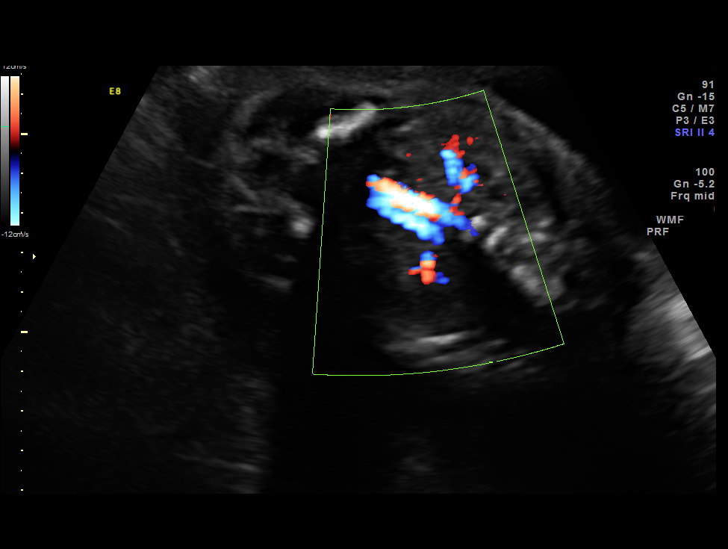
[im 50/71]
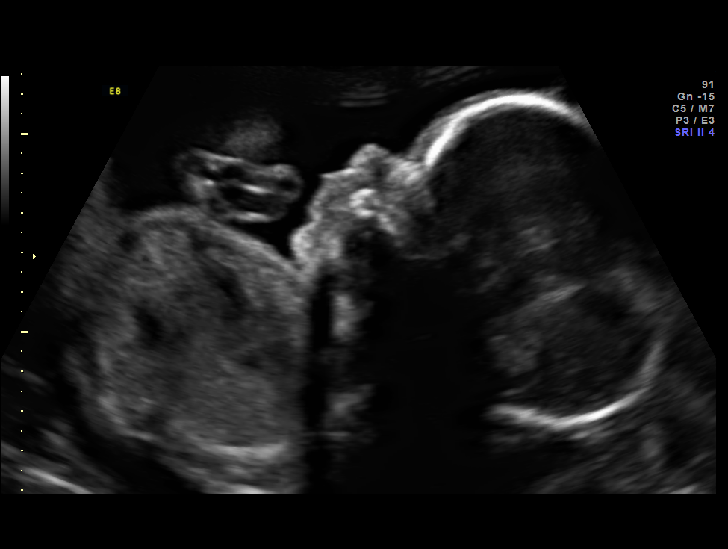
[im 58/71]
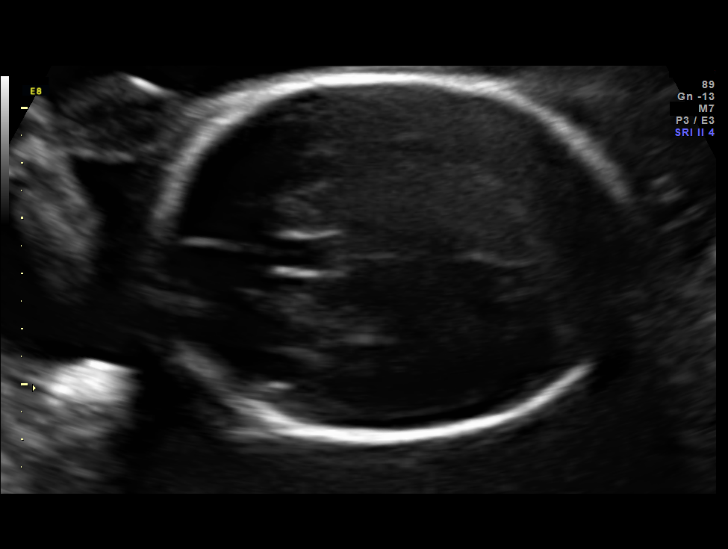
[im 63/71]
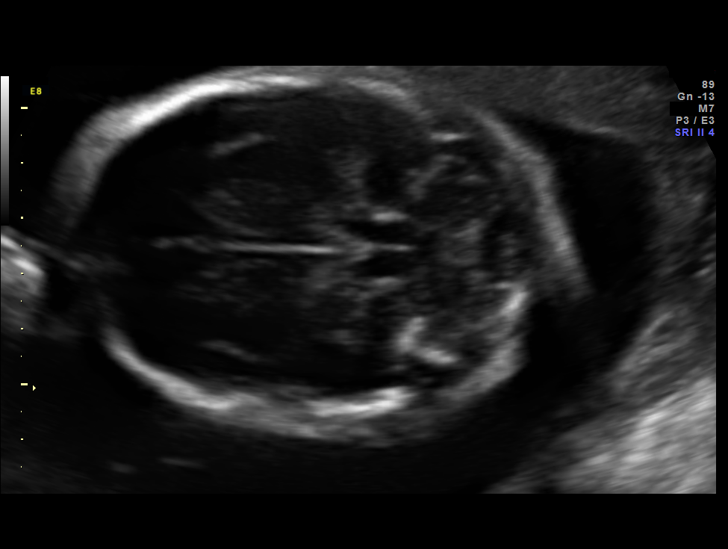
[im 68/71]
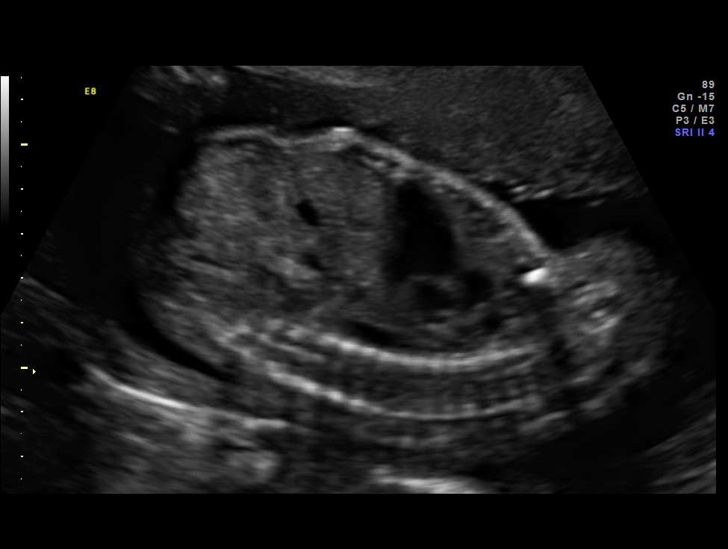

[12 of 28 positions shown; findings below may reference images not displayed]

OBSTETRICS REPORT
                      (Signed Final 12/03/2014 [DATE])

Service(s) Provided

 US OB DETAIL + 14 WK                                  76811.0
Indications

 Detailed fetal anatomic survey                        Z36
 Advanced maternal age multigravida 34+, second
 trimester
 History of genetic / anatomic abnormality - son with
 cardiac defect
 27 weeks gestation of pregnancy
 No or Little Prenatal Care
 Previous cesarean section
 Fetal abnormality - other known or suspected
 (absent nasal bone)
Fetal Evaluation

 Num Of Fetuses:    1
 Fetal Heart Rate:  148                          bpm
 Cardiac Activity:  Observed
 Presentation:      Cephalic
 Placenta:          Anterior previa

 Amniotic Fluid
 AFI FV:      Subjectively within normal limits
                                             Larg Pckt:     7.2  cm
Biometry

 BPD:     64.4  mm     G. Age:  26w 0d                CI:         69.7   70 - 86
 OFD:     92.4  mm                                    FL/HC:      19.0   18.8 -

 HC:     253.5  mm     G. Age:  27w 4d       21  %    HC/AC:      1.16   1.05 -

 AC:     217.9  mm     G. Age:  26w 2d       10  %    FL/BPD:     74.7   71 - 87
 FL:      48.1  mm     G. Age:  26w 1d        7  %    FL/AC:      22.1   20 - 24
 HUM:     46.3  mm     G. Age:  27w 2d       41  %

 Est. FW:     920  gm           2 lb     25  %
Gestational Age

 U/S Today:     26w 4d                                        EDD:   03/07/15
 Best:          27w 4d     Det. By:  Early Ultrasound         EDD:   02/28/15
                                     (08/29/14)
Anatomy

 Cranium:          Appears normal         Aortic Arch:      Appears normal
 Fetal Cavum:      Appears normal         Ductal Arch:      Appears normal
 Ventricles:       Appears normal         Diaphragm:        Appears normal
 Choroid Plexus:   Appears normal         Stomach:          Appears normal, left
                                                            sided
 Cerebellum:       Appears normal         Abdomen:          Appears normal
 Posterior Fossa:  Appears normal         Abdominal Wall:   Appears nml (cord
                                                            insert, abd wall)
 Nuchal Fold:      Not applicable (>20    Cord Vessels:     Appears normal (3
                   wks GA)                                  vessel cord)
 Face:             Appears normal         Kidneys:          Appear normal
                   (orbits and profile)
 Lips:             Appears normal         Bladder:          Appears normal
 Heart:            Appears normal         Lower             Appears normal
                   (4CH, axis, and        Extremities:
                   situs)
 RVOT:             Appears normal         Upper             Appears normal
                                          Extremities:
 LVOT:             Appears normal

 Other:  Fetus appears to be a female. Heels appears normal.
Cervix Uterus Adnexa

 Cervical Length:    3.7      cm

 Cervix:       Normal appearance by transabdominal scan. Appears
               closed, without funnelling.
Comments

 The patient's fetal anatomic survey is now complete.  An
 absent nasal bone is noted on ultrasound today.  No other
 fetal anomalies or soft markers of aneuploidy were seen.
 Although the increase in risk is lower in African Americans,
 an absent nasal bone is associated with an increased risk of
 Downs Syndrome and other aneuploidies.  Results of scan
 discussed with patient, as well as the limitations of U/S to
 detect all fetal anomalies and aneuploidies.  I reviewed Ms.
 Noya genetic testing options and she chose to have cell-
 free fetal DNA testing.
Impression

 Single living intrauterine pregnancy at 27 weeks 4 days.
 Appropriate fetal growth (25%).
 Normal amniotic fluid volume.
 Absent nasal bone.
 Otherwise normal fetal anatomy.
 No other fetal anomalies or soft markers of aneuploidy seen.
Recommendations

 If cell-free fetal DNA testing is normal no further ultrasounds
 are required unless additional problems arise.
 If cell-free fetal DNA testing is positive she should be referred
 for genetic counseling.
 questions or concerns.
                [place], [place]

## 2016-10-11 ENCOUNTER — Ambulatory Visit (HOSPITAL_COMMUNITY)
Admission: EM | Admit: 2016-10-11 | Discharge: 2016-10-11 | Disposition: A | Discharge status: Home / Self Care | Payer: PRIVATE HEALTH INSURANCE | Attending: Emergency Medicine | Admitting: Emergency Medicine

## 2016-10-11 ENCOUNTER — Encounter (HOSPITAL_COMMUNITY): Payer: Self-pay | Admitting: Emergency Medicine

## 2016-10-11 DIAGNOSIS — K047 Periapical abscess without sinus: Secondary | ICD-10-CM

## 2016-10-11 MED ORDER — AMOXICILLIN 500 MG PO CAPS: 500.0000 mg | ORAL_CAPSULE | Freq: Three times a day (TID) | ORAL | 0 refills | Status: DC

## 2016-10-11 MED ORDER — DICLOFENAC SODIUM 50 MG PO TBEC: 50.0000 mg | DELAYED_RELEASE_TABLET | Freq: Two times a day (BID) | ORAL | 0 refills | Status: DC

## 2016-10-11 NOTE — ED Provider Notes (Signed)
CSN: [id number]     Arrival date & time 10/11/16  1735 History   None    Chief Complaint  Patient presents with  . Dental Pain   (Consider location/radiation/quality/duration/timing/severity/associated sxs/prior Treatment) The history is provided by the patient. No language interpreter was used.  Dental Pain  Location:  Lower Quality:  Constant Severity:  Moderate Onset quality:  Gradual Duration:  1 week Timing:  Constant Progression:  Worsening Chronicity:  New Relieved by:  Nothing Worsened by:  Nothing Associated symptoms: no congestion   Pt complains of swelling and pain around a tooth.  Past Medical History:  Diagnosis Date  . Anemia    Past Surgical History:  Procedure Laterality Date  . CESAREAN SECTION     History reviewed. No pertinent family history. Social History  Substance Use Topics  . Smoking status: Never Smoker  . Smokeless tobacco: Never Used  . Alcohol use No   OB History    Gravida Para Term Preterm AB Living   5 3 3   1 3    SAB TAB Ectopic Multiple Live Births     1     3     Review of Systems  HENT: Negative for congestion.   All other systems reviewed and are negative.   Allergies  Patient has no known allergies.  Home Medications   Prior to Admission medications   Medication Sig Start Date End Date Taking? Authorizing Provider  amoxicillin (AMOXIL) 500 MG capsule Take 1 capsule (500 mg total) by mouth 3 (three) times daily. 10/11/16   Elson AreasLeslie K Sofia, PA-C  diclofenac (VOLTAREN) 50 MG EC tablet Take 1 tablet (50 mg total) by mouth 2 (two) times daily. 10/11/16   Elson AreasLeslie K Sofia, PA-C  Prenatal Vit-Fe Fumarate-FA (PRENATAL VITAMINS PLUS) 27-1 MG TABS Take 1 tablet by mouth daily. 01/03/15   Catalina AntiguaPeggy Constant, MD   Meds Ordered and Administered this Visit  Medications - No data to display  BP 105/80 (BP Location: Left Arm)   Pulse 71   Temp 98.8 F (37.1 C) (Oral)   LMP 05/23/2014 (Approximate)   SpO2 100%  No data  found.   Physical Exam  Constitutional: She is oriented to person, place, and time. She appears well-developed and well-nourished.  HENT:  Head: Normocephalic.  Broken tooth right lower 1st molar,  Swelling around left lower 1st and 2nd molar, probable early abscess  Eyes: EOM are normal.  Neck: Normal range of motion.  Pulmonary/Chest: Effort normal.  Abdominal: She exhibits no distension.  Musculoskeletal: Normal range of motion.  Neurological: She is alert and oriented to person, place, and time.  Psychiatric: She has a normal mood and affect.  Nursing note and vitals reviewed.   Urgent Care Course   Clinical Course     Procedures (including critical care time)  Labs Review Labs Reviewed - No data to display  Imaging Review No results found.   Visual Acuity Review  Right Eye Distance:   Left Eye Distance:   Bilateral Distance:    Right Eye Near:   Left Eye Near:    Bilateral Near:         MDM   1. Dental infection    Meds ordered this encounter  Medications  . amoxicillin (AMOXIL) 500 MG capsule    Sig: Take 1 capsule (500 mg total) by mouth 3 (three) times daily.    Dispense:  30 capsule    Refill:  0    Order Specific Question:  Supervising Provider    Answer:   Charm RingsHONIG, ERIN J [[id number]]  . diclofenac (VOLTAREN) 50 MG EC tablet    Sig: Take 1 tablet (50 mg total) by mouth 2 (two) times daily.    Dispense:  20 tablet    Refill:  0    Order Specific Question:   Supervising Provider    Answer:   Micheline ChapmanHONIG, ERIN J [[id number]]      Elson AreasLeslie K Sofia, PA-C 10/11/16 1926

## 2016-10-11 NOTE — ED Triage Notes (Signed)
Pt has been suffering from left lower dental pain for one week.  She denies fever, but has some mild facial swelling, trouble swallowing and chewing and some mild left ear pain. She denies any fever.    She is making a dental appointment for next Monday to have the tooth repaired.

## 2016-12-29 ENCOUNTER — Emergency Department (HOSPITAL_COMMUNITY)
Admission: EM | Admit: 2016-12-29 | Discharge: 2016-12-29 | Disposition: A | Discharge status: Home / Self Care | Payer: PRIVATE HEALTH INSURANCE | Attending: Emergency Medicine | Admitting: Emergency Medicine

## 2016-12-29 ENCOUNTER — Emergency Department (HOSPITAL_COMMUNITY): Payer: PRIVATE HEALTH INSURANCE

## 2016-12-29 ENCOUNTER — Encounter (HOSPITAL_COMMUNITY): Payer: Self-pay | Admitting: Emergency Medicine

## 2016-12-29 DIAGNOSIS — J029 Acute pharyngitis, unspecified: Secondary | ICD-10-CM | POA: Diagnosis present

## 2016-12-29 DIAGNOSIS — R10819 Abdominal tenderness, unspecified site: Secondary | ICD-10-CM | POA: Diagnosis not present

## 2016-12-29 DIAGNOSIS — R1900 Intra-abdominal and pelvic swelling, mass and lump, unspecified site: Secondary | ICD-10-CM

## 2016-12-29 DIAGNOSIS — J111 Influenza due to unidentified influenza virus with other respiratory manifestations: Secondary | ICD-10-CM | POA: Insufficient documentation

## 2016-12-29 DIAGNOSIS — R69 Illness, unspecified: Secondary | ICD-10-CM

## 2016-12-29 DIAGNOSIS — R1907 Generalized intra-abdominal and pelvic swelling, mass and lump: Secondary | ICD-10-CM | POA: Insufficient documentation

## 2016-12-29 LAB — CBC WITH DIFFERENTIAL/PLATELET
Basophils Absolute: 0 10*3/uL (ref 0.0–0.1)
Basophils Relative: 0 %
EOS PCT: 1 %
Eosinophils Absolute: 0.1 10*3/uL (ref 0.0–0.7)
HCT: 36.6 % (ref 36.0–46.0)
Hemoglobin: 12.1 g/dL (ref 12.0–15.0)
LYMPHS ABS: 1.8 10*3/uL (ref 0.7–4.0)
LYMPHS PCT: 21 %
MCH: 26.4 pg (ref 26.0–34.0)
MCHC: 33.1 g/dL (ref 30.0–36.0)
MCV: 79.9 fL (ref 78.0–100.0)
MONO ABS: 1 10*3/uL (ref 0.1–1.0)
MONOS PCT: 11 %
Neutro Abs: 5.8 10*3/uL (ref 1.7–7.7)
Neutrophils Relative %: 67 %
Platelets: 284 10*3/uL (ref 150–400)
RBC: 4.58 MIL/uL (ref 3.87–5.11)
RDW: 13.7 % (ref 11.5–15.5)
WBC: 8.6 10*3/uL (ref 4.0–10.5)

## 2016-12-29 LAB — I-STAT BETA HCG BLOOD, ED (MC, WL, AP ONLY)

## 2016-12-29 LAB — COMPREHENSIVE METABOLIC PANEL
ALT: 13 U/L — AB (ref 14–54)
ANION GAP: 11 (ref 5–15)
AST: 26 U/L (ref 15–41)
Albumin: 3.6 g/dL (ref 3.5–5.0)
Alkaline Phosphatase: 51 U/L (ref 38–126)
BUN: 5 mg/dL — ABNORMAL LOW (ref 6–20)
CO2: 22 mmol/L (ref 22–32)
CREATININE: 0.66 mg/dL (ref 0.44–1.00)
Calcium: 9.1 mg/dL (ref 8.9–10.3)
Chloride: 102 mmol/L (ref 101–111)
Glucose, Bld: 97 mg/dL (ref 65–99)
POTASSIUM: 3.7 mmol/L (ref 3.5–5.1)
Sodium: 135 mmol/L (ref 135–145)
Total Bilirubin: 0.8 mg/dL (ref 0.3–1.2)
Total Protein: 8.3 g/dL — ABNORMAL HIGH (ref 6.5–8.1)

## 2016-12-29 LAB — WET PREP, GENITAL
Sperm: NONE SEEN
TRICH WET PREP: NONE SEEN
WBC, Wet Prep HPF POC: NONE SEEN
Yeast Wet Prep HPF POC: NONE SEEN

## 2016-12-29 LAB — LIPASE, BLOOD: Lipase: 27 U/L (ref 11–51)

## 2016-12-29 LAB — RAPID STREP SCREEN (MED CTR MEBANE ONLY): Streptococcus, Group A Screen (Direct): NEGATIVE

## 2016-12-29 MED ORDER — MORPHINE SULFATE (PF) 4 MG/ML IV SOLN
4.0000 mg | Freq: Once | INTRAVENOUS | Status: AC
  Administered 2016-12-29: 4 mg via INTRAVENOUS
  Filled 2016-12-29: qty 1

## 2016-12-29 MED ORDER — IOPAMIDOL (ISOVUE-300) INJECTION 61%
INTRAVENOUS | Status: AC
  Administered 2016-12-29: 100 mL
  Filled 2016-12-29: qty 100

## 2016-12-29 MED ORDER — ONDANSETRON HCL 4 MG/2ML IJ SOLN
4.0000 mg | Freq: Once | INTRAMUSCULAR | Status: AC
  Administered 2016-12-29: 4 mg via INTRAVENOUS
  Filled 2016-12-29: qty 2

## 2016-12-29 MED ORDER — OSELTAMIVIR PHOSPHATE 75 MG PO CAPS: 75.0000 mg | ORAL_CAPSULE | Freq: Two times a day (BID) | ORAL | 0 refills | Status: DC

## 2016-12-29 MED ORDER — OSELTAMIVIR PHOSPHATE 75 MG PO CAPS
75.0000 mg | ORAL_CAPSULE | Freq: Once | ORAL | Status: AC
  Administered 2016-12-29: 75 mg via ORAL
  Filled 2016-12-29: qty 1

## 2016-12-29 MED ORDER — OXYCODONE-ACETAMINOPHEN 5-325 MG PO TABS: 1.0000 | ORAL_TABLET | ORAL | 0 refills | Status: DC | PRN

## 2016-12-29 NOTE — ED Notes (Signed)
Patient transported to X-ray 

## 2016-12-29 NOTE — Discharge Instructions (Signed)
Hospital should call you to set up an appointment to be seen. If they don't call you please call them at the number listed above.   Don't hesitate to return to the emergency department for any new, worsening or concerning symptoms.  Take percocet for breakthrough pain, do not drink alcohol, drive, care for children or do other critical tasks while taking percocet.  Return to the emergency room for any worsening or concerning symptoms including fast breathing, heart racing, confusion, vomiting.  Rest, cover your mouth when you cough and wash your hands frequently.   Push fluids: water or Gatorade, do not drink any soda, juice or caffeinated beverages.  For fever and pain control you can take Motrin (ibuprofen) as follows: 400 mg (this is normally 2 over the counter pills) every 4 hours with food.  Do not return to work until a day after your fever breaks.

## 2016-12-29 NOTE — ED Notes (Signed)
Pt returns from radiology. 

## 2016-12-29 NOTE — ED Provider Notes (Signed)
[facility] Provider Note   CSN: [id number] Arrival date & time: 12/29/16  1000     History   Chief Complaint Chief Complaint  Patient presents with  . Abdominal Pain  . Sore Throat  . Generalized Body Aches     HPI  Blood pressure 119/75, pulse 94, temperature 99.1 F (37.3 C), temperature source Oral, height 5' 1.5" (1.562 m), weight 74.8 kg, last menstrual period 05/23/2014, SpO2 100 %, unknown if currently breastfeeding.  Anne Burton is a 41 y.o. female complaining of myalgia, sore throat, productive cough, tactile fever onset 2 days ago she also is reporting severe right lower quadrant abdominal pain with no nausea, vomiting, change in bowel or bladder habits or abnormal vaginal discharge, chest pain or shortness of breath. She status post hysterectomy but still has her appendix. She is reduced by mouth intake. No prior history of ovarian cysts.  Past Medical History:  Diagnosis Date  . Anemia     There are no active problems to display for this patient.   Past Surgical History:  Procedure Laterality Date  . CESAREAN SECTION      OB History    Gravida Para Term Preterm AB Living   5 3 3   1 3    SAB TAB Ectopic Multiple Live Births     1     3       Home Medications    Prior to Admission medications   Medication Sig Start Date End Date Taking? Authorizing Provider  acetaminophen (TYLENOL) 325 MG tablet Take 650 mg by mouth every 6 (six) hours as needed for mild pain.   Yes Historical Provider, MD  acetaminophen-codeine (TYLENOL #3) 300-30 MG tablet Take 1 tablet by mouth daily as needed for pain. 12/10/16  Yes Historical Provider, MD  guaifenesin (ROBITUSSIN) 100 MG/5ML syrup Take 200 mg by mouth 3 (three) times daily as needed for cough.   Yes Historical Provider, MD  phenol (CHLORASEPTIC) 1.4 % LIQD Use as directed 1 spray in the mouth or throat as needed for throat irritation / pain.   Yes Historical Provider, MD  Phenylephrine-Pheniramine-DM  [facility] COLD & COUGH) 09-15-19 MG PACK Take 1 packet by mouth daily as needed (cold).   Yes Historical Provider, MD  amoxicillin (AMOXIL) 500 MG capsule Take 1 capsule (500 mg total) by mouth 3 (three) times daily. Patient not taking: Reported on 12/29/2016 10/11/16   Elson Areas, PA-C  diclofenac (VOLTAREN) 50 MG EC tablet Take 1 tablet (50 mg total) by mouth 2 (two) times daily. Patient not taking: Reported on 12/29/2016 10/11/16   Elson Areas, PA-C  oseltamivir (TAMIFLU) 75 MG capsule Take 1 capsule (75 mg total) by mouth every 12 (twelve) hours. 12/29/16   Jennamarie Goings, PA-C  oxyCODONE-acetaminophen (PERCOCET) 5-325 MG tablet Take 1 tablet by mouth every 4 (four) hours as needed. 12/29/16   Joni Reining Patryk Conant, PA-C  Prenatal Vit-Fe Fumarate-FA (PRENATAL VITAMINS PLUS) 27-1 MG TABS Take 1 tablet by mouth daily. Patient not taking: Reported on 12/29/2016 01/03/15   Catalina Antigua, MD    Family History No family history on file.  Social History Social History  Substance Use Topics  . Smoking status: Never Smoker  . Smokeless tobacco: Never Used  . Alcohol use No     Allergies   Patient has no known allergies.   Review of Systems Review of Systems  10 systems reviewed and found to be negative, except as noted in the HPI.  Physical Exam Updated  Vital Signs BP 102/65   Pulse 90   Temp 99.2 F (37.3 C) (Oral)   Resp 16   Ht 5' 1.5" (1.562 m)   Wt 74.8 kg   LMP 05/23/2014 (Approximate)   SpO2 97%   BMI 30.67 kg/m   Physical Exam  Constitutional: She is oriented to person, place, and time. She appears well-developed and well-nourished. No distress.  HENT:  Head: Normocephalic.  Right Ear: External ear normal.  Left Ear: External ear normal.  Mouth/Throat: Uvula is midline and oropharynx is clear and moist. No trismus in the jaw. No uvula swelling. No oropharyngeal exudate, posterior oropharyngeal edema, posterior oropharyngeal erythema or tonsillar abscesses.  No  drooling or stridor. Posterior pharynx mildly erythematous no significant tonsillar hypertrophy. No exudate. Soft palate rises symmetrically. No TTP or induration under tongue.   No tenderness to palpation of frontal or bilateral maxillary sinuses.  Mild mucosal edema in the nares with scant rhinorrhea.  Bilateral tympanic membranes with normal architecture and good light reflex.    Eyes: Conjunctivae and EOM are normal. Pupils are equal, round, and reactive to light.  Neck: Normal range of motion. Neck supple.  Cardiovascular: Normal rate and regular rhythm.   Pulmonary/Chest: Effort normal and breath sounds normal. No stridor. No respiratory distress. She has no wheezes. She has no rales. She exhibits no tenderness.  Abdominal: Soft. There is tenderness. There is no rebound and no guarding.  Tender to palpation over McBurney's point. Psoas positive, Rovsing and obturator are negative.  Genitourinary:  Genitourinary Comments: Pelvic exam a chaperoned by nurse: No rashes or lesions. She has thick, white, homogenous, non-foul-smelling discharge in the posterior fornix. No cervical motion tenderness, very mild right adnexal tenderness.  Musculoskeletal: Normal range of motion.  Lymphadenopathy:    She has no cervical adenopathy.  Neurological: She is alert and oriented to person, place, and time.  Psychiatric: She has a normal mood and affect.  Nursing note and vitals reviewed.    ED Treatments / Results  Labs (all labs ordered are listed, but only abnormal results are displayed) Labs Reviewed  WET PREP, GENITAL - Abnormal; Notable for the following:       Result Value   Clue Cells Wet Prep HPF POC PRESENT (*)    All other components within normal limits  COMPREHENSIVE METABOLIC PANEL - Abnormal; Notable for the following:    BUN 5 (*)    Total Protein 8.3 (*)    ALT 13 (*)    All other components within normal limits  RAPID STREP SCREEN (NOT AT [facility])  CULTURE, GROUP A STREP  ([facility])  CBC WITH DIFFERENTIAL/PLATELET  LIPASE, BLOOD  I-STAT BETA HCG BLOOD, ED ([facility], [facility], [facility] ONLY)  GC/CHLAMYDIA PROBE AMP ([place]) NOT AT [facility]    EKG  EKG Interpretation None       Radiology Dg Chest 2 View  Result Date: 12/29/2016 CLINICAL DATA:  Cough, fever and body aches. EXAM: CHEST  2 VIEW COMPARISON:  None. FINDINGS: The heart size and mediastinal contours are within normal limits. Lung volumes are low bilaterally. There is no evidence of pulmonary edema, consolidation, pneumothorax, nodule or pleural fluid. The visualized skeletal structures are unremarkable. IMPRESSION: No active cardiopulmonary disease. Electronically Signed   By: Irish LackGlenn  Yamagata M.D.   On: 12/29/2016 12:12   Ct Abdomen Pelvis W Contrast  Result Date: 12/29/2016 CLINICAL DATA:  Sharp stabbing right lower quadrant abdominal pain since Wednesday. EXAM: CT ABDOMEN AND PELVIS WITH CONTRAST TECHNIQUE: Multidetector CT  imaging of the abdomen and pelvis was performed using the standard protocol following bolus administration of intravenous contrast. CONTRAST:  ISOVUE-300 IOPAMIDOL (ISOVUE-300) INJECTION 61% COMPARISON:  None. FINDINGS: Lower chest: The lung bases are clear of acute process. No pleural effusion or pulmonary lesions. The heart is normal in size. No pericardial effusion. The distal esophagus and aorta are unremarkable. Hepatobiliary: No focal hepatic lesions or intrahepatic biliary dilatation. The gallbladder is normal. No common bile duct dilatation. Pancreas: No mass, inflammation or ductal dilatation. Spleen: Normal size.  No focal lesions. Adrenals/Urinary Tract: The adrenal glands are unremarkable. There is an indeterminate low-attenuation lesion in the upper pole region right kidney measuring 12 mm. This has high attenuation of 60 Hounsfield units and could be due to enhancement or blood. The left kidney is normal. No ureteral or bladder calculi. No bladder mass. Stomach/Bowel: The stomach,  duodenum, small bowel and colon are grossly normal without oral contrast. No inflammatory changes, mass lesions or obstructive findings. The terminal ileum and appendix are normal. Vascular/Lymphatic: No significant vascular findings are present. No enlarged abdominal or pelvic lymph nodes. Reproductive: Large cystic lesion in the right adnexa with imaging characteristics most suggestive of a complex hydrosalpinx. A cystic ovarian neoplasm is also possible but I think much less likely. It measures a maximum 10 cm. The left ovary is normal except for a small simple cyst. The uterus is surgically absent. Other: No pelvic adenopathy or free pelvic fluid collections. No inguinal mass or adenopathy. No abdominal wall hernia. Tiny periumbilical abdominal wall hernia. Musculoskeletal: No significant bony findings. IMPRESSION: 1. Large complex cystic lesion in the right adnexa with CT imaging features most suggestive of a complex hydrosalpinx. Tubo-ovarian abscess and cystic ovarian neoplasms are felt to be much less likely. Recommend correlation with the white blood cell count and fever. GYN consultation suggested. 2. The left ovary is normal except for small cysts. 3. Status post hysterectomy 4. Indeterminate 12 mm upper pole right renal lesion. Recommend followup MRI abdomen without and with contrast (non urgent). Electronically Signed   By: Rudie Meyer M.D.   On: 12/29/2016 13:47    Procedures Procedures (including critical care time)  Medications Ordered in ED Medications  iopamidol (ISOVUE-300) 61 % injection (100 mLs  Contrast Given 12/29/16 1256)  morphine 4 MG/ML injection 4 mg (4 mg Intravenous Given 12/29/16 1320)  ondansetron (ZOFRAN) injection 4 mg (4 mg Intravenous Given 12/29/16 1320)  oseltamivir (TAMIFLU) capsule 75 mg (75 mg Oral Given 12/29/16 1518)     Initial Impression / Assessment and Plan / ED Course  I have reviewed the triage vital signs and the nursing notes.  Pertinent labs & imaging  results that were available during my care of the patient were reviewed by me and considered in my medical decision making (see chart for details).     Vitals:   12/29/16 1430 12/29/16 1500 12/29/16 1515 12/29/16 1518  BP: 103/69 115/72 102/65   Pulse: 80 95 90   Resp: 16     Temp:    99.2 F (37.3 C)  TempSrc:    Oral  SpO2: 93% 97% 97%   Weight:      Height:        Medications  iopamidol (ISOVUE-300) 61 % injection (100 mLs  Contrast Given 12/29/16 1256)  morphine 4 MG/ML injection 4 mg (4 mg Intravenous Given 12/29/16 1320)  ondansetron (ZOFRAN) injection 4 mg (4 mg Intravenous Given 12/29/16 1320)  oseltamivir (TAMIFLU) capsule 75 mg (75 mg  Oral Given 12/29/16 1518)    Anne Burton is 41 y.o. female presenting with Influenza-like illness onset several days ago. Patient is nontoxic appearing. She also has focal tenderness to palpation in the right lower quadrant. Will perform pelvic exam and obtain CT to rule out appendicitis. She has productive cough, chest x-ray pending.  Pelvic exam is reassuring. Wet prep with no whites, a few clue cells however patient is not reporting any vaginal discharge, will not treat for an asymptomatic bacterial vaginosis.  Blood work reassuring with no leukocytosis.  CT shows a 10 cm mass in the right adnexa consistent with a hydrosalpinx. Less likely tubo-ovarian abscess by the lack of white cells on wet prep.  OB/GYN consult from Dr. Debroah Loop appreciated: He has looked through the patient's prior notes at [facility] and it appears that this patient has had a radical hysterectomy with bilateral salpingectomy.  Case discussed with radiologist who states that the tubular structure of the mass is most consistent with a hydrosalpinx. He states that they have seen this before and that not all of the fallopian tube is removed.  Dr. Debroah Loop will give this patient's contact information to the scheduler at [facility] hospital, I've advised her that she will need to  schedule an appointment, will be treated for flu. Extensive discussion of return precautions and patient verbalizes understanding  Evaluation does not show pathology that would require ongoing emergent intervention or inpatient treatment. Pt is hemodynamically stable and mentating appropriately. Discussed findings and plan with patient/guardian, who agrees with care plan. All questions answered. Return precautions discussed and outpatient follow up given.   Final Clinical Impressions(s) / ED Diagnoses   Final diagnoses:  Pelvic mass in female  Influenza-like illness    New Prescriptions Discharge Medication List as of 12/29/2016  3:13 PM    START taking these medications   Details  oseltamivir (TAMIFLU) 75 MG capsule Take 1 capsule (75 mg total) by mouth every 12 (twelve) hours., Starting Sat 12/29/2016, Print    oxyCODONE-acetaminophen (PERCOCET) 5-325 MG tablet Take 1 tablet by mouth every 4 (four) hours as needed., Starting Sat 12/29/2016, Print         [place], PA-C 12/30/16 0730    Jacalyn Lefevre, MD 12/30/16 [phone]

## 2016-12-29 NOTE — ED Triage Notes (Signed)
Pt c/o abdominal pain, sore throat, generalized body aches and headache onset Wednesday. 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

## 2016-12-29 NOTE — ED Notes (Signed)
Patient transported to CT 

## 2016-12-31 LAB — CULTURE, GROUP A STREP (THRC)

## 2016-12-31 LAB — GC/CHLAMYDIA PROBE AMP (~~LOC~~) NOT AT ARMC
Chlamydia: NEGATIVE
NEISSERIA GONORRHEA: NEGATIVE

## 2017-01-17 ENCOUNTER — Ambulatory Visit: Payer: PRIVATE HEALTH INSURANCE | Admitting: Obstetrics & Gynecology

## 2017-11-06 ENCOUNTER — Ambulatory Visit (INDEPENDENT_AMBULATORY_CARE_PROVIDER_SITE_OTHER): Payer: PRIVATE HEALTH INSURANCE

## 2017-11-06 ENCOUNTER — Encounter (HOSPITAL_COMMUNITY): Payer: Self-pay | Admitting: Emergency Medicine

## 2017-11-06 ENCOUNTER — Ambulatory Visit (HOSPITAL_COMMUNITY)
Admission: EM | Admit: 2017-11-06 | Discharge: 2017-11-06 | Disposition: A | Discharge status: Home / Self Care | Payer: PRIVATE HEALTH INSURANCE | Attending: Family Medicine | Admitting: Family Medicine

## 2017-11-06 DIAGNOSIS — S63501A Unspecified sprain of right wrist, initial encounter: Secondary | ICD-10-CM

## 2017-11-06 DIAGNOSIS — M25531 Pain in right wrist: Secondary | ICD-10-CM | POA: Diagnosis not present

## 2017-11-06 MED ORDER — DICLOFENAC SODIUM 75 MG PO TBEC: 75.0000 mg | DELAYED_RELEASE_TABLET | Freq: Two times a day (BID) | ORAL | 0 refills | Status: DC

## 2017-11-06 NOTE — Discharge Instructions (Signed)
Follow up if you're not seeing improvement within one week.

## 2017-11-06 NOTE — ED Triage Notes (Signed)
PT reports she picked up her granddaugther yesterday and then began having wrist pain and hand swellling to right wrist/ hand.

## 2017-11-11 NOTE — ED Provider Notes (Signed)
[facility]   [id number] 11/06/17 Arrival Time: 1640  ASSESSMENT & PLAN:  1. Sprain of right wrist, initial encounter     Meds ordered this encounter  Medications  . diclofenac (VOLTAREN) 75 MG EC tablet    Sig: Take 1 tablet (75 mg total) by mouth 2 (two) times daily.    Dispense:  14 tablet    Refill:  0   Observation. NSAID with food. Will f/u in a few days if not improving.  Reviewed expectations re: course of current medical issues. Questions answered. Outlined signs and symptoms indicating need for more acute intervention. Patient verbalized understanding. After Visit Summary given.   SUBJECTIVE:  Anne Burton is a 41 y.o. female who reports:  Wrist Pain: Patient complaints of right wrist pain. The pain began 1 day ago. The pain is located primarily in the dorsal area of wrist/hand.  She describes the symptoms as aching. Symptoms improve with nothing in particular. The symptoms are worse with certain wrist movements. No extremity sensation changes or weakness. No OTC treatment. No direct trauma to wrist/hand reported. "Just started after I picked up my granddaughter yesterday." Stable. No previous wrist problems reported.  ROS: As per HPI.   OBJECTIVE:  Vitals:   11/06/17 1743 11/06/17 1745  BP:  133/63  Pulse: 79   Resp: 16   Temp: 97.9 F (36.6 C)   TempSrc: Oral   SpO2: 100%   Weight:  192 lb (87.1 kg)  Height:  5\' 2"  (1.575 m)    General appearance: alert; no distress Extremities: no cyanosis or edema; symmetrical with no gross deformities; poorly localized tenderness over her right distal wrist and proximal hand with mild swelling and no bruising; FROM of wrist and all fingers CV: normal extremity capillary refill Skin: warm and dry Neurologic: normal gait; normal symmetric reflexes in all extremities; normal sensation Psychological: alert and cooperative; normal mood and affect  Imaging: Dg Wrist Complete Right  Result Date:  11/06/2017 CLINICAL DATA:  Right wrist pain and swelling after lifting injury. Initial encounter. EXAM: RIGHT WRIST - COMPLETE 3+ VIEW COMPARISON:  None. FINDINGS: No bony fracture identified. There is slight widening of the scapholunate distance to roughly 3 mm. This may be reflective of ligamentous injury. Carpal bones otherwise demonstrate normal alignment. No bony lesions or destruction. Soft tissues are unremarkable. IMPRESSION: No acute fracture identified. Increase in scapholunate distance may be consistent with ligamentous injury. Correlation suggested with focal tenderness in the scapholunate joint. Electronically Signed   By: Irish LackGlenn  Yamagata M.D.   On: 11/06/2017 18:48    No Known Allergies  Past Medical History:  Diagnosis Date  . Anemia    Social History   Socioeconomic History  . Marital status: Married    Spouse name: Not on file  . Number of children: Not on file  . Years of education: Not on file  . Highest education level: Not on file  Social Needs  . Financial resource strain: Not on file  . Food insecurity - worry: Not on file  . Food insecurity - inability: Not on file  . Transportation needs - medical: Not on file  . Transportation needs - non-medical: Not on file  Occupational History  . Not on file  Tobacco Use  . Smoking status: Never Smoker  . Smokeless tobacco: Never Used  Substance and Sexual Activity  . Alcohol use: No  . Drug use: No  . Sexual activity: Yes    Birth control/protection: None  Other Topics  Concern  . Not on file  Social History Narrative  . Not on file    Past Surgical History:  Procedure Laterality Date  . CESAREAN SECTION       Mardella LaymanHagler, Teige Rountree, MD 11/11/17 [phone]

## 2017-12-31 ENCOUNTER — Ambulatory Visit (HOSPITAL_COMMUNITY)
Admission: EM | Admit: 2017-12-31 | Discharge: 2017-12-31 | Disposition: A | Discharge status: Home / Self Care | Payer: BLUE CROSS/BLUE SHIELD | Attending: Emergency Medicine | Admitting: Emergency Medicine

## 2017-12-31 ENCOUNTER — Encounter (HOSPITAL_COMMUNITY): Payer: Self-pay | Admitting: Emergency Medicine

## 2017-12-31 DIAGNOSIS — M79671 Pain in right foot: Secondary | ICD-10-CM | POA: Diagnosis not present

## 2017-12-31 DIAGNOSIS — M79672 Pain in left foot: Secondary | ICD-10-CM | POA: Diagnosis not present

## 2017-12-31 MED ORDER — KETOROLAC TROMETHAMINE 30 MG/ML IJ SOLN
INTRAMUSCULAR | Status: AC
  Filled 2017-12-31: qty 1

## 2017-12-31 MED ORDER — KETOROLAC TROMETHAMINE 30 MG/ML IJ SOLN
30.0000 mg | Freq: Once | INTRAMUSCULAR | Status: AC
  Administered 2017-12-31: 30 mg via INTRAMUSCULAR

## 2017-12-31 MED ORDER — MELOXICAM 7.5 MG PO TABS: 7.5000 mg | ORAL_TABLET | Freq: Every day | ORAL | 0 refills | Status: DC

## 2017-12-31 NOTE — ED Triage Notes (Signed)
PT reports left heel pain for over 1 week. No injury. Right one hurts also

## 2017-12-31 NOTE — ED Provider Notes (Signed)
[facility]    CSN: [id number] Arrival date & time: 12/31/17  1712     History   Chief Complaint Chief Complaint  Patient presents with  . Foot Pain    HPI Anne Burton is a 42 y.o. female.   42 year old female comes in for 1 week history of bilateral heel pain, L>R. States she has had heel pain for awhile, but pain waxes and wanes. States pain is worse in the morning, and improves with some movement. Denies injury. Has been soaking area in epsom salt without improvement. Has not taken anything for the pain. 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       Past Medical History:  Diagnosis Date  . Anemia     There are no active problems to display for this patient.   Past Surgical History:  Procedure Laterality Date  . CESAREAN SECTION      OB History    Gravida Para Term Preterm AB Living   5 3 3   1 3    SAB TAB Ectopic Multiple Live Births     1     3       Home Medications    Prior to Admission medications   Medication Sig Start Date End Date Taking? Authorizing Provider  norethindrone-ethinyl estradiol-iron (ESTROSTEP FE,TILIA FE,TRI-LEGEST FE) 1-20/1-30/1-35 MG-[facility]G tablet Take 1 tablet by mouth daily.   Yes [provider]  acetaminophen (TYLENOL) 325 MG tablet Take 650 mg by mouth every 6 (six) hours as needed for mild pain.    [provider]  acetaminophen-codeine (TYLENOL #3) 300-30 MG tablet Take 1 tablet by mouth daily as needed for pain. 12/10/16   [provider]  meloxicam (MOBIC) 7.5 MG tablet Take 1 tablet (7.5 mg total) by mouth daily. 12/31/17   Belinda Fisher, PA-C    Family History No family history on file.  Social History Social History   Tobacco Use  . Smoking status: Never Smoker  . Smokeless tobacco: Never Used  Substance Use Topics  . Alcohol use: No  . Drug use: No     Allergies   Patient has no known allergies.   Review of Systems Review of Systems    Reason unable to perform ROS: See HPI as above.     Physical Exam Triage Vital Signs ED Triage Vitals  Enc Vitals Group     BP 12/31/17 1854 128/75     Pulse Rate 12/31/17 1852 78     Resp 12/31/17 1852 16     Temp 12/31/17 1852 98.6 F (37 C)     Temp Source 12/31/17 1852 Oral     SpO2 12/31/17 1852 99 %     Weight 12/31/17 1851 193 lb (87.5 kg)     Height 12/31/17 1851 5\' 2"  (1.575 m)     Head Circumference --      Peak Flow --      Pain Score 12/31/17 1851 8     Pain Loc --      Pain Edu? --      Excl. in GC? --    No data found.  Updated Vital Signs BP 128/75   Pulse 78   Temp 98.6 F (37 C) (Oral)   Resp 16   Ht 5\' 2"  (1.575 m)   Wt 193 lb (87.5 kg)   LMP 05/23/2014 (Approximate)   SpO2 99%   BMI 35.30 kg/m   Physical Exam  Constitutional: She is oriented to person, place, and time. She appears well-developed and well-nourished. No distress.  HENT:  Head: Normocephalic and atraumatic.  Eyes: Conjunctivae are normal. Pupils are equal, round, and reactive to light.  Musculoskeletal:  No obvious swelling, erythema, increased warmth. Tenderness to palpation of plantar aspect of bilateral heel. Tenderness exacerbated by dorsiflexion of the toes. No tenderness on palpation of MTPs. Slightly decreased extension of the left ankle due to pain. Strength normal and equal bilaterally. Sensation intact and equal bilaterally. Pedal pulses 2+ and equal. Cap refill <2s.  Neurological: She is alert and oriented to person, place, and time.    UC Treatments / Results  Labs (all labs ordered are listed, but only abnormal results are displayed) Labs Reviewed - No data to display  EKG  EKG Interpretation None       Radiology No results found.  Procedures Procedures (including critical care time)  Medications Ordered in UC Medications  ketorolac (TORADOL) 30 MG/ML injection 30 mg (30 mg Intramuscular Given 12/31/17 1920)     Initial Impression / Assessment and  Plan / UC Course  I have reviewed the triage vital signs and the nursing notes.  Pertinent labs & imaging results that were available during my care of the patient were reviewed by me and considered in my medical decision making (see chart for details).    Discussed possible plantar fasciitis causing symptoms. Toradol injection in office today. Start mobic as directed. Ice compress, elevation, stretching exercises. Discussed better supportive shoes to prevent further exacerbations. Information of plantar fasciitis provided. Patient to follow up with PCP/orthopedics for further evaluation if symptoms not improving. Return precautions given. Patient expresses understanding and agrees to plan.   Final Clinical Impressions(s) / UC Diagnoses   Final diagnoses:  Foot pain, bilateral    ED Discharge Orders        Ordered    meloxicam (MOBIC) 7.5 MG tablet  Daily     12/31/17 1918        Belinda FisherYu, Amy V, [place]PA-C 12/31/17 1928

## 2017-12-31 NOTE — Discharge Instructions (Signed)
Start mobic as directed. Ice compress and elevation. Supportive shoes can help prevent flare ups. Follow up with PCP/orthopedics for further evaluation needed

## 2018-02-11 ENCOUNTER — Encounter: Payer: Self-pay | Admitting: Family Medicine

## 2018-02-11 ENCOUNTER — Other Ambulatory Visit: Payer: Self-pay | Admitting: Obstetrics and Gynecology

## 2018-02-17 NOTE — H&P (Signed)
Anne Burton is a 42 y.o.  female, S/P hysterectomy, P: 3-1-2-4,  who presents for a laparoscopic bilateral  salpingectomy because of chronic pelvic pain and hydrosalpinx. 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  More recently that pain  has become more frequent and "contraction-like" .   She has not been able to identify a  trigger  for her pain  but it will begin as dull, last approximately 5 minutes, and will gradually intensify without provocation. Fortunately the pain  is decreased by lying down and taking Diclofenac.  She goes on to admits to dyspareunia and feeling bloated,  but denies changes in bowel or bladder function and has not had any problems with back pain. A pelvic ultrasound in June 2018 revealed:  a surgically absent uterus;  right ovary-5.5 cm with a simple fluid filled cystic tubular structure along the lateral border of ovary-4.80 cm (elongated cyst vs hydrosalpinx) along with a  simple cyst-3.19 cm; left ovary-2.47 cm with a hemorrhagic cyst 1.85 cm.  In February 2019,  a follow up ultrasound showed a  surgically absent uterus; left ovary: 3.97 cm  with resolved cyst anda poorly visualized  right ovary  with a likely stable hydrosalpinx seen on previous imaging.  A review of both medical and surgical management options were given to the patient however she has chosen to proceed with surgery for evaluation and management.   Past Medical History  OB History: G:5;  P: 3-0-1-3 ;  C-section x 4 1994, 1997, 2004 and 2016  GYN History: menarche: 42 YO   Denies history of abnormal PAP smear or STDs.   Last PAP smear: 2016 (normal)  Medical History: Negative  Surgical History: 2016 Hysterectomy due to Placenta Accreta Denies problems with anesthesia or history of blood transfusions  Family History: Breast Cancer, Diabetes Mellitus and Gastritis  Social History: Married and employed as a  Runner, broadcasting/film/videoTeacher;  Denies tobacco and alcohol use   Medication: Diclofenac 75 mg  daily prn-pain  No Known Allergies   Denies sensitivity to peanuts, shellfish, soy, latex or adhesives.   ROS: Admits to glasses and right thumb pain with movement but  ednies headache, vision changes, nasal congestion, dysphagia, tinnitus, dizziness, hoarseness, cough,  chest pain, shortness of breath, nausea, vomiting, diarrhea,  urinary frequency, urgency  dysuria, hematuria, vaginitis symptoms,  swelling of joints,easy bruising,  myalgias, arthralgias, skin rashes, unexplained weight loss and except as is mentioned in the history of present illness, patient's review of systems is otherwise negative.     Physical Exam  Bp: 92/60  P: 86 bpm   R: 16  Temperature: 98.9 degrees F orally;  Weight: 193 lbs.  Height 5' 1.5"  Neck: supple without masses or thyromegaly Lungs: clear to auscultation Heart: regular rate and rhythm Abdomen: soft, non-tender and no organomegaly Pelvic: patient refused exam at pre-op Extremities:  no clubbing, cyanosis or edema   Assesment: Chronic Pelvic Pain                       Hydrosalpinx   Disposition:  A discussion was held with patient regarding the indication for her procedure(s) along with the risks, which include but are not limited to: reaction to anesthesia, damage to adjacent organs, infection, excessive bleeding and possible need for an open abdominal incision. The patient verbalized understanding of these risks and has consented to proceed with a Laparoscopic Bilateral Salpingectomy  with Possible Lysis of Adhesions and Possible Laparotomy at [facility] of [place] on March 11, 2018 at 11:15 a.m.  CSN# [id number]   Anamika Kueker J. Lowell Guitar, PA-C  for Dr. Crist Fat. Rivard

## 2018-02-24 NOTE — Patient Instructions (Addendum)
Your procedure is scheduled on: Tuesday, April 16  Enter through the [facility]tr[facility] at: 9:45 am  Pick up the phone at the desk and dial [phone].  Call this number if you have problems the morning of surgery: [phone].  Remember: Do NOT eat or Do NOT drink clear liquids (including water) after midnight Monday  Take these medicines the morning of surgery with a SIP OF WATER: None  Stop herbal medications and supplements at this time.  Do NOT wear jewelry (body piercing), metal hair clips/bobby pins, make-up, or nail polish. Do NOT wear lotions, powders, or perfumes.  You may wear deoderant. Do NOT shave for 48 hours prior to surgery. Do NOT bring valuables to the hospital.  Have a responsible adult drive you home and stay with you for 24 hours after your procedure.  Home with Husband Chavis cell [phone].

## 2018-02-28 ENCOUNTER — Encounter: Payer: Self-pay | Admitting: Podiatry

## 2018-02-28 ENCOUNTER — Ambulatory Visit (INDEPENDENT_AMBULATORY_CARE_PROVIDER_SITE_OTHER): Payer: BLUE CROSS/BLUE SHIELD

## 2018-02-28 ENCOUNTER — Ambulatory Visit (INDEPENDENT_AMBULATORY_CARE_PROVIDER_SITE_OTHER): Payer: BLUE CROSS/BLUE SHIELD | Admitting: Podiatry

## 2018-02-28 ENCOUNTER — Other Ambulatory Visit: Payer: Self-pay | Admitting: Podiatry

## 2018-02-28 DIAGNOSIS — M722 Plantar fascial fibromatosis: Secondary | ICD-10-CM

## 2018-02-28 MED ORDER — MELOXICAM 15 MG PO TABS: 15.0000 mg | ORAL_TABLET | Freq: Every day | ORAL | 0 refills | Status: DC

## 2018-02-28 NOTE — Progress Notes (Signed)
Subjective:    Patient ID: Anne Burton, female    DOB: 1976/01/26, 42 y.o.   MRN: [id number]  HPI 42 year old female presents the office today for concerns of bilateral heel pain on the bottom of the heel of the left side worse than the right.  She states that she has pain in the morning she first gets up or if she has been resting for sometime and stands back up.  She denies any recent injury or trauma.  No swelling or redness.  The pain does not wake her up at night.  This is been ongoing for about 3-4 months.  No recent treatment.  No other concerns today.   Review of Systems  All other systems reviewed and are negative.  Past Medical History:  Diagnosis Date  . Anemia     Past Surgical History:  Procedure Laterality Date  . CESAREAN SECTION       Current Outpatient Medications:  .  acetaminophen (TYLENOL) 325 MG tablet, Take 650 mg by mouth every 6 (six) hours as needed for mild pain., Disp: , Rfl:  .  meloxicam (MOBIC) 15 MG tablet, Take 1 tablet (15 mg total) by mouth daily., Disp: 30 tablet, Rfl: 0 .  SYEDA 3-0.03 MG tablet, Take 1 tablet by mouth daily., Disp: , Rfl: 4  Current Facility-Administered Medications:  .  Tdap (BOOSTRIX) injection 0.5 mL, 0.5 mL, Intramuscular, Once, Elenora Fender, Walidah N, CNM  No Known Allergies  Social History   Socioeconomic History  . Marital status: Married    Spouse name: Not on file  . Number of children: Not on file  . Years of education: Not on file  . Highest education level: Not on file  Occupational History  . Not on file  Social Needs  . Financial resource strain: Not on file  . Food insecurity:    Worry: Not on file    Inability: Not on file  . Transportation needs:    Medical: Not on file    Non-medical: Not on file  Tobacco Use  . Smoking status: Never Smoker  . Smokeless tobacco: Never Used  Substance and Sexual Activity  . Alcohol use: No  . Drug use: No  . Sexual activity: Yes    Birth control/protection:  None  Lifestyle  . Physical activity:    Days per week: Not on file    Minutes per session: Not on file  . Stress: Not on file  Relationships  . Social connections:    Talks on phone: Not on file    Gets together: Not on file    Attends religious service: Not on file    Active member of club or organization: Not on file    Attends meetings of clubs or organizations: Not on file    Relationship status: Not on file  . Intimate partner violence:    Fear of current or ex partner: Not on file    Emotionally abused: Not on file    Physically abused: Not on file    Forced sexual activity: Not on file  Other Topics Concern  . Not on file  Social History Narrative  . Not on file       Objective:   Physical Exam General: AAO x3, NAD  Dermatological: Skin is warm, dry and supple bilateral. Nails x 10 are well manicured; remaining integument appears unremarkable at this time. There are no open sores, no preulcerative lesions, no rash or signs of infection present.  Vascular: Dorsalis  Pedis artery and Posterior Tibial artery pedal pulses are 2/4 bilateral with immedate capillary fill time. Pedal hair growth present. No varicosities and no lower extremity edema present bilateral. There is no pain with calf compression, swelling, warmth, erythema.   Neruologic: Grossly intact via light touch bilateral. Protective threshold with Semmes Wienstein monofilament intact to all pedal sites bilateral. Negative tinel sign bilaterally.   Musculoskeletal: Tenderness to palpation along the plantar medial tubercle of the calcaneus at the insertion of plantar fascia on the left > right  foot. There is no pain along the course of the plantar fascia within the arch of the foot. Plantar fascia appears to be intact. There is no pain with lateral compression of the calcaneus or pain with vibratory sensation. There is no pain along the course or insertion of the achilles tendon. No other areas of tenderness to  bilateral lower extremities. Muscular strength 5/5 in all groups tested bilateral.  Gait: Unassisted, Nonantalgic.     Assessment & Plan:  42 year old female with bilateral heel pain, likely plantar fasciitis -Treatment options discussed including all alternatives, risks, and complications -Etiology of symptoms were discussed -X-rays were obtained and reviewed with the patient.  No definitive evidence of acute fracture or stress fracture identified today. -Steroid injections performed bilaterally.  See procedure note below. -Plantar fascial brace was dispensed -Discussed shoe modifications and orthotics -Stretching, icing daily -Prescribed mobic. Discussed side effects of the medication and directed to stop if any are to occur and call the office.  -RTC 3 weeks or sooner if needed.   Procedure: Injection Tendon/Ligament Discussed alternatives, risks, complications and verbal consent was obtained.  Location: Bilateral plantar fascia at the glabrous junction; medial approach. Skin Prep: Alcohol. Injectate: 0.5 cc 0.5% marcaine plain, 0.5 cc 2% lidocaine plain and, 1 cc kenalog 10. Disposition: Patient tolerated procedure well. Injection site dressed with a band-aid.  Post-injection care was discussed and return precautions discussed.   Vivi BarrackMatthew R Cesar Rogerson DPM

## 2018-02-28 NOTE — Progress Notes (Signed)
Dg foot  

## 2018-02-28 NOTE — Patient Instructions (Signed)

## 2018-03-03 ENCOUNTER — Other Ambulatory Visit (HOSPITAL_COMMUNITY): Payer: BLUE CROSS/BLUE SHIELD

## 2018-03-03 DIAGNOSIS — M722 Plantar fascial fibromatosis: Secondary | ICD-10-CM | POA: Insufficient documentation

## 2018-03-05 ENCOUNTER — Encounter (HOSPITAL_COMMUNITY): Payer: Self-pay

## 2018-03-05 ENCOUNTER — Encounter (HOSPITAL_COMMUNITY)
Admission: RE | Admit: 2018-03-05 | Discharge: 2018-03-05 | Disposition: A | Discharge status: Home / Self Care | Payer: BLUE CROSS/BLUE SHIELD | Source: Ambulatory Visit | Attending: Obstetrics and Gynecology | Admitting: Obstetrics and Gynecology

## 2018-03-05 ENCOUNTER — Other Ambulatory Visit: Payer: Self-pay

## 2018-03-05 DIAGNOSIS — Z01812 Encounter for preprocedural laboratory examination: Secondary | ICD-10-CM | POA: Diagnosis not present

## 2018-03-05 HISTORY — DX: Pelvic and perineal pain: R10.2

## 2018-03-05 LAB — CBC
HEMATOCRIT: 34.8 % — AB (ref 36.0–46.0)
Hemoglobin: 11.4 g/dL — ABNORMAL LOW (ref 12.0–15.0)
MCH: 26.1 pg (ref 26.0–34.0)
MCHC: 32.8 g/dL (ref 30.0–36.0)
MCV: 79.8 fL (ref 78.0–100.0)
Platelets: 264 10*3/uL (ref 150–400)
RBC: 4.36 MIL/uL (ref 3.87–5.11)
RDW: 14.3 % (ref 11.5–15.5)
WBC: 11.3 10*3/uL — AB (ref 4.0–10.5)

## 2018-03-11 ENCOUNTER — Encounter (HOSPITAL_COMMUNITY)
Admission: RE | Disposition: A | Discharge status: Home / Self Care | Payer: Self-pay | Source: Ambulatory Visit | Attending: Obstetrics and Gynecology

## 2018-03-11 ENCOUNTER — Ambulatory Visit (HOSPITAL_COMMUNITY): Payer: BLUE CROSS/BLUE SHIELD | Admitting: Anesthesiology

## 2018-03-11 ENCOUNTER — Other Ambulatory Visit: Payer: Self-pay

## 2018-03-11 ENCOUNTER — Ambulatory Visit (HOSPITAL_COMMUNITY)
Admission: RE | Admit: 2018-03-11 | Discharge: 2018-03-11 | Disposition: A | Discharge status: Home / Self Care | Payer: BLUE CROSS/BLUE SHIELD | Source: Ambulatory Visit | Attending: Obstetrics and Gynecology | Admitting: Obstetrics and Gynecology

## 2018-03-11 DIAGNOSIS — Z90710 Acquired absence of both cervix and uterus: Secondary | ICD-10-CM | POA: Diagnosis not present

## 2018-03-11 DIAGNOSIS — Z6836 Body mass index (BMI) 36.0-36.9, adult: Secondary | ICD-10-CM | POA: Insufficient documentation

## 2018-03-11 DIAGNOSIS — Z538 Procedure and treatment not carried out for other reasons: Secondary | ICD-10-CM | POA: Diagnosis not present

## 2018-03-11 DIAGNOSIS — R102 Pelvic and perineal pain: Secondary | ICD-10-CM | POA: Diagnosis not present

## 2018-03-11 DIAGNOSIS — E669 Obesity, unspecified: Secondary | ICD-10-CM | POA: Insufficient documentation

## 2018-03-11 DIAGNOSIS — G8929 Other chronic pain: Secondary | ICD-10-CM | POA: Insufficient documentation

## 2018-03-11 DIAGNOSIS — N736 Female pelvic peritoneal adhesions (postinfective): Secondary | ICD-10-CM | POA: Diagnosis not present

## 2018-03-11 DIAGNOSIS — N941 Unspecified dyspareunia: Secondary | ICD-10-CM | POA: Insufficient documentation

## 2018-03-11 HISTORY — PX: LAPAROSCOPIC LYSIS OF ADHESIONS: SHX5905

## 2018-03-11 HISTORY — PX: LAPAROSCOPIC BILATERAL SALPINGECTOMY: SHX5889

## 2018-03-11 LAB — PREGNANCY, URINE: Preg Test, Ur: NEGATIVE

## 2018-03-11 SURGERY — SALPINGECTOMY, BILATERAL, LAPAROSCOPIC
Anesthesia: General | Site: Abdomen

## 2018-03-11 MED ORDER — HYDROMORPHONE HCL 1 MG/ML IJ SOLN
INTRAMUSCULAR | Status: AC
  Administered 2018-03-11: 0.5 mg via INTRAVENOUS
  Filled 2018-03-11: qty 0.5

## 2018-03-11 MED ORDER — FENTANYL CITRATE (PF) 100 MCG/2ML IJ SOLN
INTRAMUSCULAR | Status: AC
  Filled 2018-03-11: qty 4

## 2018-03-11 MED ORDER — LIDOCAINE HCL (CARDIAC) 20 MG/ML IV SOLN
INTRAVENOUS | Status: AC
  Filled 2018-03-11: qty 5

## 2018-03-11 MED ORDER — PROPOFOL 10 MG/ML IV BOLUS
INTRAVENOUS | Status: AC
  Filled 2018-03-11: qty 20

## 2018-03-11 MED ORDER — OXYCODONE HCL 5 MG/5ML PO SOLN: 5.0000 mg | Freq: Once | ORAL | Status: AC | PRN

## 2018-03-11 MED ORDER — PROPOFOL 10 MG/ML IV BOLUS
INTRAVENOUS | Status: DC | PRN
  Administered 2018-03-11: 150 mg via INTRAVENOUS

## 2018-03-11 MED ORDER — FENTANYL CITRATE (PF) 100 MCG/2ML IJ SOLN
INTRAMUSCULAR | Status: AC
  Filled 2018-03-11: qty 2

## 2018-03-11 MED ORDER — SCOPOLAMINE 1 MG/3DAYS TD PT72
1.0000 | MEDICATED_PATCH | Freq: Once | TRANSDERMAL | Status: DC
  Administered 2018-03-11: 1.5 mg via TRANSDERMAL

## 2018-03-11 MED ORDER — ROCURONIUM BROMIDE 100 MG/10ML IV SOLN
INTRAVENOUS | Status: AC
  Filled 2018-03-11: qty 1

## 2018-03-11 MED ORDER — MEPERIDINE HCL 25 MG/ML IJ SOLN: 6.2500 mg | INTRAMUSCULAR | Status: DC | PRN

## 2018-03-11 MED ORDER — SODIUM CHLORIDE 0.9 % IJ SOLN
INTRAMUSCULAR | Status: AC
  Filled 2018-03-11: qty 100

## 2018-03-11 MED ORDER — KETOROLAC TROMETHAMINE 30 MG/ML IJ SOLN
INTRAMUSCULAR | Status: DC | PRN
  Administered 2018-03-11: 30 mg via INTRAMUSCULAR
  Administered 2018-03-11: 30 mg via INTRAVENOUS

## 2018-03-11 MED ORDER — FENTANYL CITRATE (PF) 250 MCG/5ML IJ SOLN
INTRAMUSCULAR | Status: AC
  Filled 2018-03-11: qty 5

## 2018-03-11 MED ORDER — LIDOCAINE HCL (CARDIAC) 20 MG/ML IV SOLN
INTRAVENOUS | Status: DC | PRN
  Administered 2018-03-11: 60 mg via INTRAVENOUS

## 2018-03-11 MED ORDER — OXYCODONE-ACETAMINOPHEN 5-325 MG PO TABS: ORAL_TABLET | ORAL | 0 refills | Status: DC

## 2018-03-11 MED ORDER — SCOPOLAMINE 1 MG/3DAYS TD PT72
MEDICATED_PATCH | TRANSDERMAL | Status: AC
  Administered 2018-03-11: 1.5 mg via TRANSDERMAL
  Filled 2018-03-11: qty 1

## 2018-03-11 MED ORDER — BUPIVACAINE HCL (PF) 0.25 % IJ SOLN
INTRAMUSCULAR | Status: AC
  Filled 2018-03-11: qty 30

## 2018-03-11 MED ORDER — MIDAZOLAM HCL 2 MG/2ML IJ SOLN
INTRAMUSCULAR | Status: AC
  Filled 2018-03-11: qty 2

## 2018-03-11 MED ORDER — HYDROMORPHONE HCL 1 MG/ML IJ SOLN
INTRAMUSCULAR | Status: AC
  Filled 2018-03-11: qty 0.5

## 2018-03-11 MED ORDER — ONDANSETRON HCL 4 MG/2ML IJ SOLN
INTRAMUSCULAR | Status: AC
  Filled 2018-03-11: qty 2

## 2018-03-11 MED ORDER — HYDROMORPHONE HCL 1 MG/ML IJ SOLN
0.2500 mg | INTRAMUSCULAR | Status: DC | PRN
  Administered 2018-03-11 (×2): 0.5 mg via INTRAVENOUS

## 2018-03-11 MED ORDER — BUPIVACAINE HCL (PF) 0.25 % IJ SOLN
INTRAMUSCULAR | Status: DC | PRN
  Administered 2018-03-11: 18 mL

## 2018-03-11 MED ORDER — LACTATED RINGERS IV SOLN
INTRAVENOUS | Status: DC
  Administered 2018-03-11: 125 mL/h via INTRAVENOUS
  Administered 2018-03-11: 12:00:00 via INTRAVENOUS

## 2018-03-11 MED ORDER — IBUPROFEN 600 MG PO TABS: 600.0000 mg | ORAL_TABLET | Freq: Four times a day (QID) | ORAL | 0 refills | Status: DC | PRN

## 2018-03-11 MED ORDER — DEXAMETHASONE SODIUM PHOSPHATE 10 MG/ML IJ SOLN
INTRAMUSCULAR | Status: DC | PRN
  Administered 2018-03-11: 10 mg via INTRAVENOUS

## 2018-03-11 MED ORDER — LIDOCAINE HCL (CARDIAC) 20 MG/ML IV SOLN: INTRAVENOUS | Status: DC | PRN

## 2018-03-11 MED ORDER — DEXAMETHASONE SODIUM PHOSPHATE 10 MG/ML IJ SOLN
INTRAMUSCULAR | Status: AC
  Filled 2018-03-11: qty 1

## 2018-03-11 MED ORDER — OXYCODONE HCL 5 MG PO TABS
ORAL_TABLET | ORAL | Status: DC
Start: 2018-03-11 — End: 2018-03-11
  Filled 2018-03-11: qty 1

## 2018-03-11 MED ORDER — PROMETHAZINE HCL 25 MG/ML IJ SOLN: 6.2500 mg | INTRAMUSCULAR | Status: DC | PRN

## 2018-03-11 MED ORDER — LACTATED RINGERS IV SOLN: INTRAVENOUS | Status: DC

## 2018-03-11 MED ORDER — OXYCODONE HCL 5 MG PO TABS
5.0000 mg | ORAL_TABLET | Freq: Once | ORAL | Status: AC | PRN
  Administered 2018-03-11: 5 mg via ORAL

## 2018-03-11 MED ORDER — ROCURONIUM BROMIDE 100 MG/10ML IV SOLN
INTRAVENOUS | Status: DC | PRN
  Administered 2018-03-11: 50 mg via INTRAVENOUS

## 2018-03-11 MED ORDER — MIDAZOLAM HCL 2 MG/2ML IJ SOLN
INTRAMUSCULAR | Status: DC | PRN
  Administered 2018-03-11: 2 mg via INTRAVENOUS

## 2018-03-11 MED ORDER — SUGAMMADEX SODIUM 200 MG/2ML IV SOLN
INTRAVENOUS | Status: DC | PRN
  Administered 2018-03-11: 175 mg via INTRAVENOUS

## 2018-03-11 MED ORDER — FENTANYL CITRATE (PF) 100 MCG/2ML IJ SOLN
INTRAMUSCULAR | Status: DC | PRN
  Administered 2018-03-11 (×2): 100 ug via INTRAVENOUS

## 2018-03-11 MED ORDER — ONDANSETRON HCL 4 MG/2ML IJ SOLN
INTRAMUSCULAR | Status: DC | PRN
  Administered 2018-03-11: 4 mg via INTRAVENOUS

## 2018-03-11 MED ORDER — KETOROLAC TROMETHAMINE 30 MG/ML IJ SOLN
INTRAMUSCULAR | Status: AC
  Filled 2018-03-11: qty 2

## 2018-03-11 MED ORDER — VASOPRESSIN 20 UNIT/ML IV SOLN
INTRAVENOUS | Status: AC
  Filled 2018-03-11: qty 1

## 2018-03-11 SURGICAL SUPPLY — 34 items
APPLICATOR COTTON TIP 6 STRL (MISCELLANEOUS) ×1 IMPLANT
APPLICATOR COTTON TIP 6IN STRL (MISCELLANEOUS) ×3
BARRIER ADHS 3X4 INTERCEED (GAUZE/BANDAGES/DRESSINGS) IMPLANT
CABLE HIGH FREQUENCY MONO STRZ (ELECTRODE) ×3 IMPLANT
DERMABOND ADVANCED (GAUZE/BANDAGES/DRESSINGS) ×2
DERMABOND ADVANCED .7 DNX12 (GAUZE/BANDAGES/DRESSINGS) ×1 IMPLANT
DISSECTOR BLUNT TIP ENDO 5MM (MISCELLANEOUS) IMPLANT
DURAPREP 26ML APPLICATOR (WOUND CARE) ×3 IMPLANT
ELECT REM PT RETURN 9FT ADLT (ELECTROSURGICAL) ×3
ELECTRODE REM PT RTRN 9FT ADLT (ELECTROSURGICAL) ×1 IMPLANT
FORCEPS CUTTING 33CM 5MM (CUTTING FORCEPS) IMPLANT
GLOVE BIOGEL PI IND STRL 7.0 (GLOVE) ×4 IMPLANT
GLOVE BIOGEL PI INDICATOR 7.0 (GLOVE) ×8
GLOVE ECLIPSE 6.5 STRL STRAW (GLOVE) ×3 IMPLANT
GOWN STRL REUS W/TWL LRG LVL3 (GOWN DISPOSABLE) ×6 IMPLANT
PACK LAPAROSCOPY BASIN (CUSTOM PROCEDURE TRAY) ×3 IMPLANT
PACK TRENDGUARD 450 HYBRID PRO (MISCELLANEOUS) ×1 IMPLANT
PACK TRENDGUARD 600 HYBRD PROC (MISCELLANEOUS) IMPLANT
POUCH SPECIMEN RETRIEVAL 10MM (ENDOMECHANICALS) IMPLANT
PROTECTOR NERVE ULNAR (MISCELLANEOUS) ×6 IMPLANT
SCISSORS LAP 5X35 DISP (ENDOMECHANICALS) ×3 IMPLANT
SET IRRIG TUBING LAPAROSCOPIC (IRRIGATION / IRRIGATOR) IMPLANT
SLEEVE XCEL OPT CAN 5 100 (ENDOMECHANICALS) ×3 IMPLANT
SOLUTION ELECTROLUBE (MISCELLANEOUS) IMPLANT
SUT MNCRL AB 3-0 PS2 27 (SUTURE) ×3 IMPLANT
SUT VICRYL 0 UR6 27IN ABS (SUTURE) ×6 IMPLANT
SYR 50ML LL SCALE MARK (SYRINGE) ×3 IMPLANT
TOWEL OR 17X24 6PK STRL BLUE (TOWEL DISPOSABLE) ×6 IMPLANT
TRAY FOLEY W/BAG SLVR 14FR (SET/KITS/TRAYS/PACK) ×3 IMPLANT
TRENDGUARD 450 HYBRID PRO PACK (MISCELLANEOUS) ×3
TRENDGUARD 600 HYBRID PROC PK (MISCELLANEOUS)
TROCAR BALLN 12MMX100 BLUNT (TROCAR) ×3 IMPLANT
TROCAR XCEL NON-BLD 5MMX100MML (ENDOMECHANICALS) ×3 IMPLANT
WARMER LAPAROSCOPE (MISCELLANEOUS) ×3 IMPLANT

## 2018-03-11 NOTE — Interval H&P Note (Signed)
History and Physical Interval Note:  03/11/2018 10:59 AM  Anne Burton  has presented today for surgery, with the diagnosis of Right Hydrosalpinx with Desire for Sterilization  The various methods of treatment have been discussed with the patient and family. After consideration of risks, benefits and other options for treatment, the patient has consented to  Procedure(s): LAPAROSCOPIC BILATERAL SALPINGECTOMY (Bilateral) as a surgical intervention .  The patient's history has been reviewed, patient examined, no change in status, stable for surgery.  I have reviewed the patient's chart and labs.  Questions were answered to the patient's satisfaction.     Dois DavenportSandra A Shamarion Coots

## 2018-03-11 NOTE — Anesthesia Preprocedure Evaluation (Addendum)
Anesthesia Evaluation  Patient identified by MRN, date of birth, ID band Patient awake    Reviewed: Allergy & Precautions, NPO status , Patient's Chart, lab work & pertinent test results  Airway Mallampati: III  TM Distance: >3 FB Neck ROM: Full    Dental  (+) Teeth Intact, Dental Advisory Given   Pulmonary neg pulmonary ROS,    breath sounds clear to auscultation       Cardiovascular  Rhythm:Regular Rate:Normal     Neuro/Psych negative neurological ROS  negative psych ROS   GI/Hepatic negative GI ROS, Neg liver ROS,   Endo/Other  negative endocrine ROS  Renal/GU negative Renal ROS     Musculoskeletal negative musculoskeletal ROS (+)   Abdominal (+) + obese,   Peds  Hematology   Anesthesia Other Findings   Reproductive/Obstetrics negative OB ROS                            Anesthesia Physical Anesthesia Plan  ASA: II  Anesthesia Plan: General   Post-op Pain Management:    Induction: Intravenous  PONV Risk Score and Plan: 4 or greater and Ondansetron, Dexamethasone, Midazolam and Scopolamine patch - Pre-op  Airway Management Planned: Oral ETT  Additional Equipment: None  Intra-op Plan:   Post-operative Plan: Extubation in OR  Informed Consent: I have reviewed the patients History and Physical, chart, labs and discussed the procedure including the risks, benefits and alternatives for the proposed anesthesia with the patient or authorized representative who has indicated his/her understanding and acceptance.   Dental advisory given  Plan Discussed with: CRNA  Anesthesia Plan Comments:        Anesthesia Quick Evaluation

## 2018-03-11 NOTE — Op Note (Signed)
Preoperative diagnosis: Chronic pelvic pain with hydrosalpynx  Postoperative diagnosis: Severe pelvic adhesions  Anesthesia: Gen.  Anesthesiologist: Dr. Hart RochesterHollis  Procedure: Laparoscopic lysis of adhesions  Surgeon: Dr. Dois DavenportSandra Kainoah Bartosiewicz  Assistant: Kerry FortElmira PA-C  Estimated blood loss: Minimal  Procedure:  After being informed of the planned procedure with possible complications including bleeding, infection, injury to other organs,informed consent is obtained and the patient is taken to OR # 7. She is placed in lithotomy position, prepped and draped in a sterile fashion, and her bladder is emptied with an in and out red rubber catheter.  Pelvic exam: Normal external genitalia, absent cervix, absent uterus and normal adnexa  A sponge on a stick is inserted in the vagina  We infiltrate 2 cm above umbilical area using 10 cc of Marcaine 0.25 and perform a vertical incision which is brought down bluntly to the fascia. The fascia is identified and grasped with Coker forceps. The fascia is opened with Mayo scissors. Peritoneum is entered bluntly. A pursestring suture of 0 Vicryl is placed on the fascia. A 10 mm Hassan trocar is easily inserted in the abdominal cavity and is held in place both by the intrauterine balloon and the previously placed pursestring suture. This allows for easy insufflation of the pneumoperitoneum using warm CO2 at a maximum pressure of 15 mmHg. A operative laparoscope is inserted in the abdominal cavity.  Observation: Upon entering the abdominal cavity, we note extensive adhesions involving multiple bowel loops and omentum with the abdominal wall, right and left pelvic wall. Liver and gallbladder are visualized and normal. Appendix is not visualized. We particularly make note of a large bowel loop under tension in the right lower quadrant and of the sigmoid colon in the left lower quadrant.  We place 2   5 mm trocars under direct visualization in the LLQ and RLQ after  infiltrating each site with 10 cc of Marcaine 0.25%.   Using hot scissors and traction we are able to free the right lower quadrant bowel loop until it falls back without tension as well as the left sigmoid bowel loop. We are unable to locate either ovaries or either tubes. Multiple bowel loops are adherent to the all vaginal vault. We irrigate with warm saline and note a satisfactory hemostasis. At this point we terminate the surgery due to extensive adhesive disease in a patient who has not been bowel prepped or consented for laparotomy.should the right lower quadrant pain persists despite our partial lysis of adhesion, patient would be referred for extensive surgery possible laparotomy with bowel preparation.    All instruments are then removed after evacuating the pneumoperitoneum. The fascia of the umbilical incision is closed with the purse string suture of 0 Vicryl. The skin of all 3 incisions is closed with a subcuticular suture of 3-0 Monocryl and Dermabond.  Instrument and sponge count is complete x2. Estimated blood loss is minimal.The procedure is well tolerated by the patient is taken to recovery room in a well and stable condition.  Specimen: none

## 2018-03-11 NOTE — Anesthesia Procedure Notes (Signed)
Procedure Name: Intubation Date/Time: 03/11/2018 11:25 AM Performed by: Genevie Ann, CRNA Pre-anesthesia Checklist: Patient identified, Emergency Drugs available, Suction available, Patient being monitored and Timeout performed Patient Re-evaluated:Patient Re-evaluated prior to induction Oxygen Delivery Method: Circle system utilized Preoxygenation: Pre-oxygenation with 100% oxygen Induction Type: IV induction Ventilation: Oral airway inserted - appropriate to patient size Laryngoscope Size: Mac and 3 Grade View: Grade I Tube type: Oral Tube size: 7.0 mm Number of attempts: 1 Placement Confirmation: positive ETCO2,  ETT inserted through vocal cords under direct vision,  CO2 detector and breath sounds checked- equal and bilateral Secured at: 21 cm Dental Injury: Teeth and Oropharynx as per pre-operative assessment

## 2018-03-11 NOTE — Transfer of Care (Signed)
Immediate Anesthesia Transfer of Care Note  Patient: Anne Burton  Procedure(s) Performed: OPERATIVE LAPAROSCOPY WITH EXTENSIVE LYSIS OF ADHESIONS (N/A Abdomen) LAPAROSCOPIC LYSIS OF ADHESIONS (N/A Abdomen)  Patient Location: PACU  Anesthesia Type:General  Level of Consciousness: awake, alert  and oriented  Airway & Oxygen Therapy: Patient Spontanous Breathing and Patient connected to nasal cannula oxygen  Post-op Assessment: Report given to RN and Post -op Vital signs reviewed and stable  Post vital signs: Reviewed and stable  Last Vitals:  Vitals Value Taken Time  BP    Temp    Pulse 74 03/11/2018  1:03 PM  Resp 21 03/11/2018  1:03 PM  SpO2 100 % 03/11/2018  1:03 PM  Vitals shown include unvalidated device data.  Last Pain:  Vitals:   03/11/18 0957  TempSrc: Oral      Patients Stated Pain Goal: 3 (03/11/18 0957)  Complications: No apparent anesthesia complications

## 2018-03-11 NOTE — Discharge Instructions (Signed)
DISCHARGE INSTRUCTIONS: Laparoscopy with Lysis of Adhesions The following instructions have been prepared to help you care for yourself upon your return home today.  **No IBUPROFEN until 6:47 pm today**  Wound care:  Do not get the incision wet for the first 24 hours. The incision should be kept clean and dry.  The belly button bandage may be removed the second day after surgery(Thursday).  Should the incision become sore, red, and swollen after the first week, check with your doctor.  Personal hygiene:  Shower the day after your procedure.  Activity and limitations:  Do NOT drive or operate any equipment for 1 week.  Do NOT lift anything more than 20 pounds for 2 weeks after surgery.  Do NOT rest in bed all day.  Walking is encouraged. Walk each day, starting slowly with 5-minute walks 3 or 4 times a day. Slowly increase the length of your walks.  Walk up and down stairs slowly.  Do NOT do strenuous activities, such as golfing, playing tennis, bowling, running, biking, weight lifting, gardening, mowing, or vacuuming for 2-4 weeks. Ask your doctor when it is okay to start. **No sexual intercourse for 2 weeks**  Diet: Eat a light meal as desired this evening. You may resume your usual diet tomorrow.  Return to work: This will be determined at your post-op appointment  What to expect after your surgery: You may have a slight burning sensation when you urinate on the first day. You may have a very small amount of blood in the urine. Expect to have a small amount of vaginal discharge/light bleeding for 1-2 weeks. It is not unusual to have abdominal soreness and bruising for up to 2 weeks. You may be tired and need more rest for about 1 week. You may experience shoulder pain for 24-72 hours. Lying flat in bed may relieve it.  Call your doctor for any of the following:  Develop a fever of 100.4 or greater  Inability to urinate 6 hours after discharge from hospital  Severe pain  not relieved by pain medications  Persistent of heavy bleeding at incision site  Redness or swelling around incision site after a week  Increasing nausea or vomiting  Patient Signature________________________________________  Nurse Signature_________________________________________  Support person's signature________________________________  [facility]  [phone])  for excessive pain, bleeding or temperature greater than or equal to 100.4 degrees (orally).     Use Ibuprofen 600 mg every 6 hours, with food  for 5 days and then as needed. Use your pain medication as needed to maintain a pain level at or below 3/10 Use Colace 1-2 capsules per day as long as you are using pain medication to avoid constipation

## 2018-03-12 ENCOUNTER — Encounter (HOSPITAL_COMMUNITY): Payer: Self-pay | Admitting: Obstetrics and Gynecology

## 2018-03-12 NOTE — Anesthesia Postprocedure Evaluation (Signed)
Anesthesia Post Note  Patient: Remonia Ethridge  Procedure(s) Performed: OPERATIVE LAPAROSCOPY WITH EXTENSIVE LYSIS OF ADHESIONS (N/A Abdomen) LAPAROSCOPIC LYSIS OF ADHESIONS (N/A Abdomen)     Patient location during evaluation: PACU Anesthesia Type: General Level of consciousness: awake and alert Pain management: pain level controlled Vital Signs Assessment: post-procedure vital signs reviewed and stable Respiratory status: spontaneous breathing, nonlabored ventilation, respiratory function stable and patient connected to nasal cannula oxygen Cardiovascular status: blood pressure returned to baseline and stable Postop Assessment: no apparent nausea or vomiting Anesthetic complications: no    Last Vitals:  Vitals:   03/11/18 1400 03/11/18 1450  BP:  123/68  Pulse: 75 77  Resp: 17 18  Temp: 36.4 C   SpO2: 97% 98%    Last Pain:  Vitals:   03/11/18 1450  TempSrc:   PainSc: 4                  Shelton SilvasKevin D Anael Rosch

## 2018-03-21 ENCOUNTER — Ambulatory Visit (INDEPENDENT_AMBULATORY_CARE_PROVIDER_SITE_OTHER): Payer: BLUE CROSS/BLUE SHIELD | Admitting: Podiatry

## 2018-03-21 DIAGNOSIS — M722 Plantar fascial fibromatosis: Secondary | ICD-10-CM

## 2018-03-23 NOTE — Progress Notes (Signed)
Subjective:   Patient ID: Anne Burton, female   DOB: 42 y.o.   MRN: [id number]   HPI 42 year old female presents the office today for follow-up evaluation of bilateral heel pain.  She says the right side is doing well but she still getting some discomfort in the left heel but it is improving.  She denies any recent injury or trauma.  She had no complications after the injection last appointment.  The majority pain in the left feels in the morning and she first gets up and she still describes stiffness in the area.  She denies any swelling or redness or any other changes.  She has been trying to stretch and ice as much as possible.  No other concerns.   Review of Systems  All other systems reviewed and are negative.       Objective:  Physical Exam  General: AAO x3, NAD  Dermatological: Skin is warm, dry and supple bilateral. Nails x 10 are well manicured; remaining integument appears unremarkable at this time. There are no open sores, no preulcerative lesions, no rash or signs of infection present.  Vascular: Dorsalis Pedis artery and Posterior Tibial artery pedal pulses are 2/4 bilateral with immedate capillary fill time. Pedal hair growth present. No varicosities and no lower extremity edema present bilateral. There is no pain with calf compression, swelling, warmth, erythema.   Neruologic: Grossly intact via light touch bilateral.   Musculoskeletal: At this time there is no tenderness palpation on the plantar medial tubercle of the calcaneus at the insertion of plantar fascia on the right foot.  Mild discomfort on the plantar medial tubercle of the left heel but it does appear to be improved.  There is no pain on the course the plantar fascial the arch of the foot.  No pain on the Achilles tendon.  No pain with lateral compression of calcaneus.  There is no overlying edema, erythema, increase in warmth.  Muscular strength 5/5 in all groups tested bilateral.  Gait: Unassisted,  Nonantalgic.       Assessment:   Resolving plantar fasciitis bilaterally; continue pain left heel    Plan:  -Treatment options discussed including all alternatives, risks, and complications -Today steroid injection was performed only on the left side.  See procedure note below. -Night splint dispensed -Continue stretching, icing exercises daily -Continue with shoe modifications and discussed orthotics. -She is taking ibuprofen and Percocet due to surgery she just recently had so therefore we will continue with meloxicam. -Follow-up in 3 weeks if symptoms continue or sooner if any issues are to arise.  Procedure: Injection Tendon/Ligament Discussed alternatives, risks, complications and verbal consent was obtained.  Location: Left plantar fascia at the glabrous junction; medial approach. Skin Prep: Alcohol. Injectate: 0.5cc 0.5% marcaine plain, 0.5 cc 2% lidocaine plain and, 1 cc kenalog 10. Disposition: Patient tolerated procedure well. Injection site dressed with a band-aid.  Post-injection care was discussed and return precautions discussed.     Vivi Barrack DPM

## 2018-04-22 ENCOUNTER — Ambulatory Visit: Payer: BLUE CROSS/BLUE SHIELD | Admitting: Podiatry

## 2019-09-21 DIAGNOSIS — Z111 Encounter for screening for respiratory tuberculosis: Secondary | ICD-10-CM | POA: Diagnosis not present

## 2019-09-23 DIAGNOSIS — Z021 Encounter for pre-employment examination: Secondary | ICD-10-CM | POA: Diagnosis not present

## 2019-09-23 DIAGNOSIS — Z111 Encounter for screening for respiratory tuberculosis: Secondary | ICD-10-CM | POA: Diagnosis not present

## 2019-12-01 ENCOUNTER — Other Ambulatory Visit: Payer: Self-pay

## 2019-12-01 ENCOUNTER — Encounter (HOSPITAL_COMMUNITY): Payer: Self-pay | Admitting: Emergency Medicine

## 2019-12-01 ENCOUNTER — Ambulatory Visit (HOSPITAL_COMMUNITY)
Admission: EM | Admit: 2019-12-01 | Discharge: 2019-12-01 | Disposition: A | Discharge status: Home / Self Care | Payer: BC Managed Care – PPO

## 2019-12-01 DIAGNOSIS — K047 Periapical abscess without sinus: Secondary | ICD-10-CM | POA: Diagnosis not present

## 2019-12-01 MED ORDER — AMOXICILLIN-POT CLAVULANATE 875-125 MG PO TABS: 1.0000 | ORAL_TABLET | Freq: Two times a day (BID) | ORAL | 0 refills | Status: DC

## 2019-12-01 MED ORDER — IBUPROFEN 800 MG PO TABS: 800.0000 mg | ORAL_TABLET | Freq: Three times a day (TID) | ORAL | 0 refills | Status: DC

## 2019-12-01 NOTE — ED Provider Notes (Signed)
[facility]    CSN: [id number] Arrival date & time: 12/01/19  9741      History   Chief Complaint Chief Complaint  Patient presents with  . Abscess    HPI Anne Burton is a 44 y.o. female.   Patient is a 44 year old female presents today with left-sided facial swelling and left upper dental pain/abscess.  Symptoms been constant and worsening over the past 2 to 3 days.  She has been taking Tylenol with some relief.  No fever, chills, body aches, night sweats.  No trismus.  Plans to follow-up with dentist.  ROS per HPI      Past Medical History:  Diagnosis Date  . Anemia   . Pelvic pain    hydrosalpynx    Patient Active Problem List   Diagnosis Date Noted  . Plantar fasciitis 03/03/2018  . S/P TAH (total abdominal hysterectomy) 02/10/2015  . Multigravida of advanced maternal age 08/19/2015  . Fetal disorder 01/18/2015  . Uterine scar from previous cesarean delivery, antepartum 01/18/2015  . History of cesarean section 12/07/2014  . Not immune to rubella 12/02/2014  . Supervision of high risk pregnancy due to social problems in third trimester 12/01/2014    Past Surgical History:  Procedure Laterality Date  . ABDOMINAL HYSTERECTOMY     done with 2016 C/S Surgery-general anesthesia  . CESAREAN SECTION  1994,1997,2004,2016   x 4  . LAPAROSCOPIC BILATERAL SALPINGECTOMY N/A 03/11/2018   Procedure: OPERATIVE LAPAROSCOPY WITH EXTENSIVE LYSIS OF ADHESIONS;  Surgeon: Silverio Lay, MD;  Location: [facility];  Service: Gynecology;  Laterality: N/A;  . LAPAROSCOPIC LYSIS OF ADHESIONS N/A 03/11/2018   Procedure: LAPAROSCOPIC LYSIS OF ADHESIONS;  Surgeon: Silverio Lay, MD;  Location: [facility];  Service: Gynecology;  Laterality: N/A;    OB History    Gravida  5   Para  3   Term  3   Preterm      AB  1   Living  3     SAB      TAB  1   Ectopic      Multiple      Live Births  3            Home Medications    Prior to Admission medications    Medication Sig Start Date End Date Taking? Authorizing Provider  acetaminophen (TYLENOL) 325 MG tablet Take 650 mg by mouth every 6 (six) hours as needed.   Yes [provider]  amoxicillin-clavulanate (AUGMENTIN) 875-125 MG tablet Take 1 tablet by mouth every 12 (twelve) hours. 12/01/19   Dahlia Byes A, NP  ibuprofen (ADVIL) 800 MG tablet Take 1 tablet (800 mg total) by mouth 3 (three) times daily. 12/01/19   Janace Aris, NP    Family History Family History  Problem Relation Age of Onset  . Hypertension Mother   . Diabetes Mother     Social History Social History   Tobacco Use  . Smoking status: Never Smoker  . Smokeless tobacco: Never Used  Substance Use Topics  . Alcohol use: No  . Drug use: No     Allergies   Patient has no known allergies.   Review of Systems Review of Systems   Physical Exam Triage Vital Signs ED Triage Vitals  Enc Vitals Group     BP 12/01/19 0938 138/82     Pulse Rate 12/01/19 0938 65     Resp 12/01/19 0938 20     Temp 12/01/19 0938 99.1 F (  37.3 C)     Temp Source 12/01/19 0938 Oral     SpO2 12/01/19 0938 99 %     Weight --      Height --      Head Circumference --      Peak Flow --      Pain Score 12/01/19 0935 10     Pain Loc --      Pain Edu? --      Excl. in [place]? --    No data found.  Updated Vital Signs BP 138/82 (BP Location: Left Arm)   Pulse 65   Temp 99.1 F (37.3 C) (Oral)   Resp 20   LMP 05/23/2014 (Approximate)   SpO2 99%   Visual Acuity Right Eye Distance:   Left Eye Distance:   Bilateral Distance:    Right Eye Near:   Left Eye Near:    Bilateral Near:     Physical Exam Vitals and nursing note reviewed.  Constitutional:      General: She is not in acute distress.    Appearance: Normal appearance. She is not ill-appearing, toxic-appearing or diaphoretic.  HENT:     Head: Normocephalic.     Nose: Nose normal.     Mouth/Throat:     Pharynx: Oropharynx is clear.      Comments: Moderated  right sided facial swelling. No trismus Gingival swelling and mild tenderness.  Eyes:     Conjunctiva/sclera: Conjunctivae normal.  Pulmonary:     Effort: Pulmonary effort is normal.  Musculoskeletal:        General: Normal range of motion.     Cervical back: Normal range of motion.  Skin:    General: Skin is warm and dry.     Findings: No rash.  Neurological:     Mental Status: She is alert.  Psychiatric:        Mood and Affect: Mood normal.      UC Treatments / Results  Labs (all labs ordered are listed, but only abnormal results are displayed) Labs Reviewed - No data to display  EKG   Radiology No results found.  Procedures Procedures (including critical care time)  Medications Ordered in UC Medications - No data to display  Initial Impression / Assessment and Plan / UC Course  I have reviewed the triage vital signs and the nursing notes.  Pertinent labs & imaging results that were available during my care of the patient were reviewed by me and considered in my medical decision making (see chart for details).     Dental infection-treating with Augmentin and 800 mg ibuprofen Recommend follow-up with dentist Return precautions given Final Clinical Impressions(s) / UC Diagnoses   Final diagnoses:  Dental abscess     Discharge Instructions     Treating you for a dental infection Take the medication as prescribed  Ibuprofen for pain, swelling.  Follow up as needed for continued or worsening symptoms     ED Prescriptions    Medication Sig Dispense Auth. Provider   amoxicillin-clavulanate (AUGMENTIN) 875-125 MG tablet Take 1 tablet by mouth every 12 (twelve) hours. 14 tablet Damarea Merkel A, NP   ibuprofen (ADVIL) 800 MG tablet Take 1 tablet (800 mg total) by mouth 3 (three) times daily. 21 tablet Lamyiah Crawshaw A, NP     PDMP not reviewed this encounter.   Orvan July, NP 12/01/19 1009

## 2019-12-01 NOTE — Discharge Instructions (Addendum)
Treating you for a dental infection Take the medication as prescribed  Ibuprofen for pain, swelling.  Follow up as needed for continued or worsening symptoms

## 2019-12-01 NOTE — ED Triage Notes (Signed)
Dental abscess top left, onset Sunday-11/29/2019  Swelling is getting worse.   Did a rapid test at [facility] and was told she has covid.  11/22/2020

## 2020-08-10 DIAGNOSIS — Z111 Encounter for screening for respiratory tuberculosis: Secondary | ICD-10-CM | POA: Diagnosis not present

## 2020-08-12 DIAGNOSIS — Z111 Encounter for screening for respiratory tuberculosis: Secondary | ICD-10-CM | POA: Diagnosis not present

## 2020-09-16 ENCOUNTER — Ambulatory Visit (INDEPENDENT_AMBULATORY_CARE_PROVIDER_SITE_OTHER): Payer: BC Managed Care – PPO | Admitting: Family Medicine

## 2020-09-16 ENCOUNTER — Other Ambulatory Visit: Payer: Self-pay

## 2020-09-16 ENCOUNTER — Encounter: Payer: Self-pay | Admitting: Family Medicine

## 2020-09-16 VITALS — BP 120/80 | HR 84 | Ht 63.0 in | Wt 211.0 lb

## 2020-09-16 DIAGNOSIS — Z23 Encounter for immunization: Secondary | ICD-10-CM

## 2020-09-16 DIAGNOSIS — M25532 Pain in left wrist: Secondary | ICD-10-CM

## 2020-09-16 DIAGNOSIS — E669 Obesity, unspecified: Secondary | ICD-10-CM | POA: Diagnosis not present

## 2020-09-16 DIAGNOSIS — M722 Plantar fascial fibromatosis: Secondary | ICD-10-CM | POA: Diagnosis not present

## 2020-09-16 DIAGNOSIS — R202 Paresthesia of skin: Secondary | ICD-10-CM

## 2020-09-16 DIAGNOSIS — M25531 Pain in right wrist: Secondary | ICD-10-CM | POA: Diagnosis not present

## 2020-09-16 DIAGNOSIS — R200 Anesthesia of skin: Secondary | ICD-10-CM

## 2020-09-16 LAB — COMPREHENSIVE METABOLIC PANEL

## 2020-09-16 LAB — SEDIMENTATION RATE

## 2020-09-16 LAB — CBC WITH DIFFERENTIAL/PLATELET
Eos: 2 %
Immature Grans (Abs): 0 10*3/uL (ref 0.0–0.1)
MCH: 26.1 pg — ABNORMAL LOW (ref 26.6–33.0)
MCHC: 31.8 g/dL (ref 31.5–35.7)
MCV: 82 fL (ref 79–97)
Neutrophils: 61 %
RBC: 4.33 x10E6/uL (ref 3.77–5.28)
WBC: 6.6 10*3/uL (ref 3.4–10.8)

## 2020-09-16 NOTE — Progress Notes (Signed)
Subjective:    Patient ID: Anne Burton, female    DOB: 01/03/1976, 44 y.o.   MRN: [id number]  HPI Chief Complaint  Patient presents with  . new pt    new pt, had rash but now gone, carpal tunnel in both hands, weight put strain on lower back, feet pain due to plantar facisitis, declines flu shot   She is new to the practice and here to get established.  Previous medical care: no regular care since 2016  Other providers: None   She is concerned with obesity and would like help with weight loss.  States she has tried low calorie diets in the past. States she has also tried exercising. Nothing seems to work per patient. Painful to exercise recently due to plantar fascitis. She has been seen by [facility] and had steroid shots.  Complains of a several year history of wrist pain, numbness and tingling in both hands. She also reports having swelling occasionally in both hands and wrist. No redness. States this started while pregnant in 2016.   States she has been doing basic treatment and has been using splints at night.  She has stiffness of both wrists in the morning but only for 10 minutes or so.  Has tried tylenol.     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    Review of Systems Pertinent positives and negatives in the history of present illness.     Objective:   Physical Exam Constitutional:      General: She is not in acute distress.    Appearance: Normal appearance. She is not ill-appearing.  Eyes:     Conjunctiva/sclera: Conjunctivae normal.  Cardiovascular:     Rate and Rhythm: Normal rate and regular rhythm.     Pulses: Normal pulses.     Heart  sounds: Normal heart sounds.  Pulmonary:     Effort: Pulmonary effort is normal.     Breath sounds: Normal breath sounds.  Musculoskeletal:     Right wrist: No swelling or tenderness. Normal range of motion. Normal pulse.     Left wrist: No swelling or tenderness. Normal range of motion. Normal pulse.     Right hand: Normal.     Left hand: Normal.     Comments: Negative Tinel's and Phalens but she reports numbness and tingling constant  Neurological:     General: No focal deficit present.     Mental Status: She is alert and oriented to person, place, and time.     Sensory: No sensory deficit.     Motor: No weakness.     Coordination: Coordination normal.  Psychiatric:        Mood and Affect: Mood normal.        Behavior: Behavior normal.        Thought Content: Thought content normal.    BP 120/80   Pulse 84   Ht 5\' 3"  (1.6 m)   Wt 211 lb (95.7 kg)   LMP 05/23/2014 (Approximate)   Breastfeeding No   BMI 37.38 kg/m       Assessment & Plan:  Pain in both wrists - Plan: CBC with Differential/Platelet, Comprehensive metabolic panel, Sedimentation rate, Ambulatory referral to Hand Surgery -  She is new to the practice and here to establish care.  5-year history of bilateral wrist pain and numbness and tingling which sound suspicious for carpal tunnel syndrome.  I will check labs including sed rate.  She is already trying conservative treatment including using response at night.  I will refer her to the hand center for further evaluation and treatment.  Numbness and tingling in both hands - Plan: CBC with Differential/Platelet, Comprehensive metabolic panel, TSH, T4, free, Vitamin B12, Ambulatory referral to Hand Surgery -Check labs.  Referral to the hand center.  Obesity (BMI 30-39.9) - Plan: CBC with Differential/Platelet, Comprehensive metabolic panel, TSH, T4, free -Fairly in-depth counseling on healthy diet, tracking calories and carbohydrates for weight loss.  Also recommend  increasing physical activity.  Plantar fasciitis -She is aware of conservative treatment for this and is doing this already.  She is a patient of [facility] and has been seen there for steroid injections.  She will follow-up as needed.  Needs flu shot - Plan: Flu Vaccine QUAD 36+ mos IM -Counseling on potential side effects.

## 2020-09-16 NOTE — Patient Instructions (Signed)
You will receive a call from [facility].   Carpal Tunnel Syndrome  Carpal tunnel syndrome is a condition that causes pain in your hand and arm. The carpal tunnel is a narrow area that is on the palm side of your wrist. Repeated wrist motion or certain diseases may cause swelling in the tunnel. This swelling can pinch the main nerve in the wrist (median nerve). What are the causes? This condition may be caused by:  Repeated wrist motions.  Wrist injuries.  Arthritis.  A sac of fluid (cyst) or abnormal growth (tumor) in the carpal tunnel.  Fluid buildup during pregnancy. Sometimes the cause is not known. What increases the risk? The following factors may make you more likely to develop this condition:  Having a job in which you move your wrist in the same way many times. This includes jobs like being a Midwife or a Conservation officer, nature.  Being a woman.  Having other health conditions, such as: ? Diabetes. ? Obesity. ? A thyroid gland that is not active enough (hypothyroidism). ? Kidney failure. What are the signs or symptoms? Symptoms of this condition include:  A tingling feeling in your fingers.  Tingling or a loss of feeling (numbness) in your hand.  Pain in your entire arm. This pain may get worse when you bend your wrist and elbow for a long time.  Pain in your wrist that goes up your arm to your shoulder.  Pain that goes down into your palm or fingers.  A weak feeling in your hands. You may find it hard to grab and hold items. You may feel worse at night. How is this diagnosed? This condition is diagnosed with a medical history and physical exam. You may also have tests, such as:  Electromyogram (EMG). This test checks the signals that the nerves send to the muscles.  Nerve conduction study. This test checks how well signals pass through your nerves.  Imaging tests, such as X-rays, ultrasound, and MRI. These tests check for what might be the cause of your  condition. How is this treated? This condition may be treated with:  Lifestyle changes. You will be asked to stop or change the activity that caused your problem.  Doing exercise and activities that make bones and muscles stronger (physical therapy).  Learning how to use your hand again (occupational therapy).  Medicines for pain and swelling (inflammation). You may have injections in your wrist.  A wrist splint.  Surgery. Follow these instructions at home: If you have a splint:  Wear the splint as told by your doctor. Remove it only as told by your doctor.  Loosen the splint if your fingers: ? Tingle. ? Lose feeling (become numb). ? Turn cold and blue.  Keep the splint clean.  If the splint is not waterproof: ? Do not let it get wet. ? Cover it with a watertight covering when you take a bath or a shower. Managing pain, stiffness, and swelling   If told, put ice on the painful area: ? If you have a removable splint, remove it as told by your doctor. ? Put ice in a plastic bag. ? Place a towel between your skin and the bag. ? Leave the ice on for 20 minutes, 2-3 times per day. General instructions  Take over-the-counter and prescription medicines only as told by your doctor.  Rest your wrist from any activity that may cause pain. If needed, talk with your boss at work about changes that can help your  wrist heal.  Do any exercises as told by your doctor, physical therapist, or occupational therapist.  Keep all follow-up visits as told by your doctor. This is important. Contact a doctor if:  You have new symptoms.  Medicine does not help your pain.  Your symptoms get worse. Get help right away if:  You have very bad numbness or tingling in your wrist or hand. Summary  Carpal tunnel syndrome is a condition that causes pain in your hand and arm.  It is often caused by repeated wrist motions.  Lifestyle changes and medicines are used to treat this problem.  Surgery may help in very bad cases.  Follow your doctor's instructions about wearing a splint, resting your wrist, keeping follow-up visits, and calling for help. This information is not intended to replace advice given to you by your health care provider. Make sure you discuss any questions you have with your health care provider. Document Revised: 03/21/2018 Document Reviewed: 03/21/2018 Elsevier Patient Education  2020 [employer]    Plantar Fasciitis  Plantar fasciitis is a painful foot condition that affects the heel. It occurs when the band of tissue that connects the toes to the heel bone (plantar fascia) becomes irritated. This can happen as the result of exercising too much or doing other repetitive activities (overuse injury). The pain from plantar fasciitis can range from mild irritation to severe pain that makes it difficult to walk or move. The pain is usually worse in the morning after sleeping, or after sitting or lying down for a while. Pain may also be worse after long periods of walking or standing. What are the causes? This condition may be caused by:  Standing for long periods of time.  Wearing shoes that do not have good arch support.  Doing activities that put stress on joints (high-impact activities), including running, aerobics, and ballet.  Being overweight.  An abnormal way of walking (gait).  Tight muscles in the back of your lower leg (calf).  High arches in your feet.  Starting a new athletic activity. What are the signs or symptoms? The main symptom of this condition is heel pain. Pain may:  Be worse with first steps after a time of rest, especially in the morning after sleeping or after you have been sitting or lying down for a while.  Be worse after long periods of standing still.  Decrease after 30-45 minutes of activity, such as gentle walking. How is this diagnosed? This condition may be diagnosed based on your medical history and your  symptoms. Your health care provider may ask questions about your activity level. Your health care provider will do a physical exam to check for:  A tender area on the bottom of your foot.  A high arch in your foot.  Pain when you move your foot.  Difficulty moving your foot. You may have imaging tests to confirm the diagnosis, such as:  X-rays.  Ultrasound.  MRI. How is this treated? Treatment for plantar fasciitis depends on how severe your condition is. Treatment may include:  Rest, ice, applying pressure (compression), and raising the affected foot (elevation). This may be called RICE therapy. Your health care provider may recommend RICE therapy along with over-the-counter pain medicines to manage your pain.  Exercises to stretch your calves and your plantar fascia.  A splint that holds your foot in a stretched, upward position while you sleep (night splint).  Physical therapy to relieve symptoms and prevent problems in the future.  Injections  of steroid medicine (cortisone) to relieve pain and inflammation.  Stimulating your plantar fascia with electrical impulses (extracorporeal shock wave therapy). This is usually the last treatment option before surgery.  Surgery, if other treatments have not worked after 12 months. Follow these instructions at home:  Managing pain, stiffness, and swelling  If directed, put ice on the painful area: ? Put ice in a plastic bag, or use a frozen bottle of water. ? Place a towel between your skin and the bag or bottle. ? Roll the bottom of your foot over the bag or bottle. ? Do this for 20 minutes, 2-3 times a day.  Wear athletic shoes that have air-sole or gel-sole cushions, or try wearing soft shoe inserts that are designed for plantar fasciitis.  Raise (elevate) your foot above the level of your heart while you are sitting or lying down. Activity  Avoid activities that cause pain. Ask your health care provider what activities are  safe for you.  Do physical therapy exercises and stretches as told by your health care provider.  Try activities and forms of exercise that are easier on your joints (low-impact). Examples include swimming, water aerobics, and biking. General instructions  Take over-the-counter and prescription medicines only as told by your health care provider.  Wear a night splint while sleeping, if told by your health care provider. Loosen the splint if your toes tingle, become numb, or turn cold and blue.  Maintain a healthy weight, or work with your health care provider to lose weight as needed.  Keep all follow-up visits as told by your health care provider. This is important. Contact a health care provider if you:  Have symptoms that do not go away after caring for yourself at home.  Have pain that gets worse.  Have pain that affects your ability to move or do your daily activities. Summary  Plantar fasciitis is a painful foot condition that affects the heel. It occurs when the band of tissue that connects the toes to the heel bone (plantar fascia) becomes irritated.  The main symptom of this condition is heel pain that may be worse after exercising too much or standing still for a long time.  Treatment varies, but it usually starts with rest, ice, compression, and elevation (RICE therapy) and over-the-counter medicines to manage pain. This information is not intended to replace advice given to you by your health care provider. Make sure you discuss any questions you have with your health care provider. Document Revised: 10/25/2017 Document Reviewed: 09/09/2017 Elsevier Patient Education  2020 [employer].

## 2020-09-17 LAB — CBC WITH DIFFERENTIAL/PLATELET
Basophils Absolute: 0 10*3/uL (ref 0.0–0.2)
Basos: 1 %
EOS (ABSOLUTE): 0.1 10*3/uL (ref 0.0–0.4)
Hematocrit: 35.5 % (ref 34.0–46.6)
Hemoglobin: 11.3 g/dL (ref 11.1–15.9)
Immature Granulocytes: 0 %
Lymphocytes Absolute: 2 10*3/uL (ref 0.7–3.1)
Lymphs: 31 %
Monocytes Absolute: 0.3 10*3/uL (ref 0.1–0.9)
Monocytes: 5 %
Neutrophils Absolute: 4.1 10*3/uL (ref 1.4–7.0)
Platelets: 357 10*3/uL (ref 150–450)
RDW: 13.3 % (ref 11.7–15.4)

## 2020-09-17 LAB — COMPREHENSIVE METABOLIC PANEL
ALT: 7 IU/L (ref 0–32)
AST: 18 IU/L (ref 0–40)
Alkaline Phosphatase: 68 IU/L (ref 44–121)
BUN/Creatinine Ratio: 17 (ref 9–23)
CO2: 22 mmol/L (ref 20–29)
Calcium: 9.6 mg/dL (ref 8.7–10.2)
Chloride: 105 mmol/L (ref 96–106)
Creatinine, Ser: 0.66 mg/dL (ref 0.57–1.00)
GFR calc non Af Amer: 107 mL/min/{1.73_m2} (ref >59)
Globulin, Total: 3.2 g/dL (ref 1.5–4.5)
Potassium: 3.6 mmol/L (ref 3.5–5.2)
Sodium: 141 mmol/L (ref 134–144)
Total Protein: 7.3 g/dL (ref 6.0–8.5)

## 2020-09-17 LAB — VITAMIN B12: Vitamin B-12: 469 pg/mL (ref 232–1245)

## 2020-09-17 LAB — T4, FREE: Free T4: 0.89 ng/dL (ref 0.82–1.77)

## 2020-09-17 LAB — TSH: TSH: 0.61 u[IU]/mL (ref 0.450–4.500)

## 2020-09-18 ENCOUNTER — Other Ambulatory Visit: Payer: Self-pay | Admitting: Family Medicine

## 2020-09-18 DIAGNOSIS — R700 Elevated erythrocyte sedimentation rate: Secondary | ICD-10-CM

## 2020-09-18 DIAGNOSIS — M25531 Pain in right wrist: Secondary | ICD-10-CM

## 2020-09-18 DIAGNOSIS — M25532 Pain in left wrist: Secondary | ICD-10-CM

## 2020-09-18 DIAGNOSIS — R200 Anesthesia of skin: Secondary | ICD-10-CM

## 2020-09-18 DIAGNOSIS — R202 Paresthesia of skin: Secondary | ICD-10-CM

## 2020-09-18 NOTE — Progress Notes (Signed)
Her sed rate was elevated so I would like to check additional labs to see if she has an inflammatory arthritis. I also want her to go to GI for bilateral wrist XRs and these orders are also in.  Please schedule her for a lab visit and explain how to get her XRs done.  She can hold off on the hand specialist until we get these results.

## 2020-09-19 ENCOUNTER — Telehealth: Payer: Self-pay | Admitting: Family Medicine

## 2020-09-19 NOTE — Telephone Encounter (Signed)
Pt returned your call, I read her the message from [facility] and scheduled her for lab work on Wednesday. I explained to her that she wanted xrays and that we typically  Put orders in for [facility] and she can go by to get xrays.  I told her you would contact her about when/where to go for the xrays either by phone or MyChart

## 2020-09-20 ENCOUNTER — Ambulatory Visit
Admission: RE | Admit: 2020-09-20 | Discharge: 2020-09-20 | Disposition: A | Discharge status: Home / Self Care | Payer: BC Managed Care – PPO | Source: Ambulatory Visit | Attending: Family Medicine | Admitting: Family Medicine

## 2020-09-20 DIAGNOSIS — M25531 Pain in right wrist: Secondary | ICD-10-CM

## 2020-09-20 DIAGNOSIS — R700 Elevated erythrocyte sedimentation rate: Secondary | ICD-10-CM

## 2020-09-20 DIAGNOSIS — R200 Anesthesia of skin: Secondary | ICD-10-CM

## 2020-09-20 DIAGNOSIS — R202 Paresthesia of skin: Secondary | ICD-10-CM

## 2020-09-21 ENCOUNTER — Other Ambulatory Visit: Payer: BC Managed Care – PPO

## 2020-09-21 ENCOUNTER — Other Ambulatory Visit: Payer: Self-pay | Admitting: Internal Medicine

## 2020-09-21 ENCOUNTER — Ambulatory Visit
Admission: RE | Admit: 2020-09-21 | Discharge: 2020-09-21 | Disposition: A | Discharge status: Home / Self Care | Payer: BC Managed Care – PPO | Source: Ambulatory Visit | Attending: Family Medicine | Admitting: Family Medicine

## 2020-09-21 ENCOUNTER — Other Ambulatory Visit: Payer: Self-pay

## 2020-09-21 DIAGNOSIS — G8929 Other chronic pain: Secondary | ICD-10-CM | POA: Diagnosis not present

## 2020-09-21 DIAGNOSIS — M25531 Pain in right wrist: Secondary | ICD-10-CM | POA: Diagnosis not present

## 2020-09-21 DIAGNOSIS — R200 Anesthesia of skin: Secondary | ICD-10-CM | POA: Diagnosis not present

## 2020-09-21 DIAGNOSIS — R700 Elevated erythrocyte sedimentation rate: Secondary | ICD-10-CM

## 2020-09-21 DIAGNOSIS — R202 Paresthesia of skin: Secondary | ICD-10-CM

## 2020-09-21 DIAGNOSIS — M7989 Other specified soft tissue disorders: Secondary | ICD-10-CM | POA: Diagnosis not present

## 2020-09-21 DIAGNOSIS — M25532 Pain in left wrist: Secondary | ICD-10-CM | POA: Diagnosis not present

## 2020-09-21 DIAGNOSIS — M79641 Pain in right hand: Secondary | ICD-10-CM | POA: Diagnosis not present

## 2020-09-23 LAB — RHEUMATOID FACTOR: Rheumatoid fact SerPl-aCnc: <10 IU/mL (ref 0.0–13.9)

## 2020-09-23 LAB — CYCLIC CITRUL PEPTIDE ANTIBODY, IGG/IGA: Cyclic Citrullin Peptide Ab: 6 units (ref 0–19)

## 2020-09-25 NOTE — Progress Notes (Signed)
Please contact her and make sure she knows that I recommend seeing the hand specialist at this point. Negative rheumatoid test and normal hand xrays

## 2020-10-05 DIAGNOSIS — R202 Paresthesia of skin: Secondary | ICD-10-CM | POA: Diagnosis not present

## 2020-10-05 DIAGNOSIS — R200 Anesthesia of skin: Secondary | ICD-10-CM | POA: Diagnosis not present

## 2020-10-06 ENCOUNTER — Encounter: Payer: Self-pay | Admitting: Neurology

## 2020-10-06 ENCOUNTER — Other Ambulatory Visit: Payer: Self-pay

## 2020-10-06 DIAGNOSIS — R202 Paresthesia of skin: Secondary | ICD-10-CM

## 2020-10-18 ENCOUNTER — Encounter: Payer: BC Managed Care – PPO | Admitting: Family Medicine

## 2020-10-25 NOTE — Patient Instructions (Addendum)
Cut back on sugar and carbohydrates such as potatoes, pasta, bread, rice and increase your physical activity.  Call and schedule your mammogram at the breast center as discussed.  You will receive a call from [facility] gastroenterology to schedule a visit about your screening colonoscopy which is due.  I will be in touch with your lab results  Return at your convenience for your Covid booster   Preventive Care 16-45 Years Old, Female Preventive care refers to visits with your health care provider and lifestyle choices that can promote health and wellness. This includes:  A yearly physical exam. This may also be called an annual well check.  Regular dental visits and eye exams.  Immunizations.  Screening for certain conditions.  Healthy lifestyle choices, such as eating a healthy diet, getting regular exercise, not using drugs or products that contain nicotine and tobacco, and limiting alcohol use. What can I expect for my preventive care visit? Physical exam Your health care provider will check your:  Height and weight. This may be used to calculate body mass index (BMI), which tells if you are at a healthy weight.  Heart rate and blood pressure.  Skin for abnormal spots. Counseling Your health care provider may ask you questions about your:  Alcohol, tobacco, and drug use.  Emotional well-being.  Home and relationship well-being.  Sexual activity.  Eating habits.  Work and work Statistician.  Method of birth control.  Menstrual cycle.  Pregnancy history. What immunizations do I need?  Influenza (flu) vaccine  This is recommended every year. Tetanus, diphtheria, and pertussis (Tdap) vaccine  You may need a Td booster every 10 years. Varicella (chickenpox) vaccine  You may need this if you have not been vaccinated. Zoster (shingles) vaccine  You may need this after age 29. Measles, mumps, and rubella (MMR) vaccine  You may need at least one dose of MMR  if you were born in 1957 or later. You may also need a second dose. Pneumococcal conjugate (PCV13) vaccine  You may need this if you have certain conditions and were not previously vaccinated. Pneumococcal polysaccharide (PPSV23) vaccine  You may need one or two doses if you smoke cigarettes or if you have certain conditions. Meningococcal conjugate (MenACWY) vaccine  You may need this if you have certain conditions. Hepatitis A vaccine  You may need this if you have certain conditions or if you travel or work in places where you may be exposed to hepatitis A. Hepatitis B vaccine  You may need this if you have certain conditions or if you travel or work in places where you may be exposed to hepatitis B. Haemophilus influenzae type b (Hib) vaccine  You may need this if you have certain conditions. Human papillomavirus (HPV) vaccine  If recommended by your health care provider, you may need three doses over 6 months. You may receive vaccines as individual doses or as more than one vaccine together in one shot (combination vaccines). Talk with your health care provider about the risks and benefits of combination vaccines. What tests do I need? Blood tests  Lipid and cholesterol levels. These may be checked every 5 years, or more frequently if you are over 43 years old.  Hepatitis C test.  Hepatitis B test. Screening  Lung cancer screening. You may have this screening every year starting at age 32 if you have a 30-pack-year history of smoking and currently smoke or have quit within the past 15 years.  Colorectal cancer screening. All adults should  have this screening starting at age 91 and continuing until age 98. Your health care provider may recommend screening at age 24 if you are at increased risk. You will have tests every 1-10 years, depending on your results and the type of screening test.  Diabetes screening. This is done by checking your blood sugar (glucose) after you have  not eaten for a while (fasting). You may have this done every 1-3 years.  Mammogram. This may be done every 1-2 years. Talk with your health care provider about when you should start having regular mammograms. This may depend on whether you have a family history of breast cancer.  BRCA-related cancer screening. This may be done if you have a family history of breast, ovarian, tubal, or peritoneal cancers.  Pelvic exam and Pap test. This may be done every 3 years starting at age 60. Starting at age 10, this may be done every 5 years if you have a Pap test in combination with an HPV test. Other tests  Sexually transmitted disease (STD) testing.  Bone density scan. This is done to screen for osteoporosis. You may have this scan if you are at high risk for osteoporosis. Follow these instructions at home: Eating and drinking  Eat a diet that includes fresh fruits and vegetables, whole grains, lean protein, and low-fat dairy.  Take vitamin and mineral supplements as recommended by your health care provider.  Do not drink alcohol if: ? Your health care provider tells you not to drink. ? You are pregnant, may be pregnant, or are planning to become pregnant.  If you drink alcohol: ? Limit how much you have to 0-1 drink a day. ? Be aware of how much alcohol is in your drink. In the [place] one drink equals one 12 oz bottle of beer (355 mL), one 5 oz glass of wine (148 mL), or one 1 oz glass of hard liquor (44 mL). Lifestyle  Take daily care of your teeth and gums.  Stay active. Exercise for at least 30 minutes on 5 or more days each week.  Do not use any products that contain nicotine or tobacco, such as cigarettes, e-cigarettes, and chewing tobacco. If you need help quitting, ask your health care provider.  If you are sexually active, practice safe sex. Use a condom or other form of birth control (contraception) in order to prevent pregnancy and STIs (sexually transmitted infections).  If  told by your health care provider, take low-dose aspirin daily starting at age 61. What's next?  Visit your health care provider once a year for a well check visit.  Ask your health care provider how often you should have your eyes and teeth checked.  Stay up to date on all vaccines. This information is not intended to replace advice given to you by your health care provider. Make sure you discuss any questions you have with your health care provider. Document Revised: 07/24/2018 Document Reviewed: 07/24/2018 Elsevier Patient Education  2020 [employer].

## 2020-10-25 NOTE — Progress Notes (Signed)
Subjective:    Patient ID: Anne Burton, female    DOB: 12/01/1975, 44 y.o.   MRN: [id number]  HPI Chief Complaint  Patient presents with  . fasting cpe    fasitng cpe, pap back in 2016- doesn't see obgyn   She is here for a complete physical exam. 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   Health maintenance:  Mammogram: 2016  Colonoscopy: never  Last Gynecological Exam: 3 years ago  Last Menstrual cycle: hysterectomy  Last Dental Exam: overdue  Last Eye Exam: last year   Wears seatbelt always, smoke detectors in home and functioning, does not text while driving and feels safe in home environment.   Reviewed allergies, medications, past medical, surgical, family, and social history.   Review of Systems Review of Systems Constitutional: -fever, -chills, -sweats, -unexpected weight change,-fatigue ENT: -runny nose, -ear pain, -sore throat Cardiology:  -chest pain, -palpitations, -edema Respiratory: -cough, -shortness of breath, -wheezing Gastroenterology: -abdominal pain, -nausea, -vomiting, -diarrhea, -constipation  Hematology: -bleeding or bruising problems Musculoskeletal: -arthralgias, -myalgias, -joint swelling, -back pain Ophthalmology: -vision changes Urology: -dysuria, -difficulty urinating, -hematuria, -urinary frequency, -urgency Neurology: -headache, -weakness, -tingling, -numbness       Objective:   Physical Exam BP 120/68   Pulse 83   Ht 5' 1.5" (1.562 m)   Wt 209 lb 6.4 oz (95 kg)   LMP 05/23/2014 (Approximate)   BMI 38.93 kg/m   General Appearance:    Alert, cooperative, no distress, appears stated age  Head:    Normocephalic, without obvious  abnormality, atraumatic  Eyes:    PERRL, conjunctiva/corneas clear, EOM's intact  Ears:    Normal TM's and external ear canals  Nose:   Mask on   Throat:   Mask on   Neck:   Supple, no lymphadenopathy;  thyroid:  no   enlargement/tenderness/nodules; no JVD  Back:    Spine nontender, no curvature, ROM normal, no CVA     tenderness  Lungs:     Clear to auscultation bilaterally without wheezes, rales or     ronchi; respirations unlabored  Chest Wall:    No tenderness or deformity   Heart:    Regular rate and rhythm, S1 and S2 normal, no murmur, rub   or gallop  Breast Exam:    No tenderness, masses, or nipple discharge or inversion.      No axillary lymphadenopathy  Abdomen:     Soft, non-tender, nondistended, normoactive bowel sounds,    no masses, no hepatosplenomegaly  Genitalia:    Normal external genitalia without lesions.  BUS and vagina normal. No abnormal vaginal discharge.  Adnexa without fullness or palpable masses      Extremities:   No clubbing, cyanosis or edema  Pulses:   2+ and symmetric all extremities  Skin:   Skin color, texture, turgor normal, no rashes or lesions  Lymph nodes:   Cervical, supraclavicular, and axillary nodes normal  Neurologic:   CNII-XII intact, normal strength, sensation and gait; reflexes 2+ and symmetric throughout          Psych:   Normal mood, affect, hygiene and grooming.        Assessment & Plan:  Routine general medical examination at a health care facility - Plan: Comprehensive metabolic panel, TSH, T4, free, T3, Lipid panel -She is fairly new to the practice.  She has been overdue for preventive health care for some time.  She will call and schedule her mammogram.  I will refer her to GI for her for screening colonoscopy.  Counseling on healthy lifestyle.  Immunizations reviewed.  Discussed safety and health promotion.  Recommend regular dental and eye exams.  Obesity (BMI 30-39.9) - Plan: Comprehensive metabolic panel, Hemoglobin A1c, Lipid  panel -Counseling on cutting back on carbohydrates and sugar and increasing physical activity.  I will check labs including hemoglobin A1c since she is concerned about diabetes in the future.  She does have a family history of diabetes.  Need for hepatitis C screening test - Plan: Hepatitis C antibody -Done per screening guidelines  Screen for colon cancer - Plan: Ambulatory referral to Gastroenterology -Done per screening guidelines  Encounter for screening mammogram for malignant neoplasm of breast - Plan: MM DIGITAL SCREENING BILATERAL  Family history of diabetes mellitus in mother - Plan: Hemoglobin A1c -She is aware that this increases her risk of developing diabetes  Family history of heart disease in female family member before age 14 - Plan: EKG 12-Lead  Screening for heart disease - Plan: EKG 12-Lead -EKG shows NSR with rate 70, PR interval 128 ms, QRS duration 82 ms, QT/QTc 396/427

## 2020-10-26 ENCOUNTER — Ambulatory Visit (INDEPENDENT_AMBULATORY_CARE_PROVIDER_SITE_OTHER): Payer: BC Managed Care – PPO | Admitting: Family Medicine

## 2020-10-26 ENCOUNTER — Encounter: Payer: Self-pay | Admitting: Family Medicine

## 2020-10-26 ENCOUNTER — Other Ambulatory Visit: Payer: Self-pay

## 2020-10-26 VITALS — BP 120/68 | HR 83 | Ht 61.5 in | Wt 209.4 lb

## 2020-10-26 DIAGNOSIS — Z1159 Encounter for screening for other viral diseases: Secondary | ICD-10-CM | POA: Diagnosis not present

## 2020-10-26 DIAGNOSIS — Z8249 Family history of ischemic heart disease and other diseases of the circulatory system: Secondary | ICD-10-CM | POA: Diagnosis not present

## 2020-10-26 DIAGNOSIS — Z1231 Encounter for screening mammogram for malignant neoplasm of breast: Secondary | ICD-10-CM

## 2020-10-26 DIAGNOSIS — Z833 Family history of diabetes mellitus: Secondary | ICD-10-CM

## 2020-10-26 DIAGNOSIS — Z1211 Encounter for screening for malignant neoplasm of colon: Secondary | ICD-10-CM | POA: Diagnosis not present

## 2020-10-26 DIAGNOSIS — Z136 Encounter for screening for cardiovascular disorders: Secondary | ICD-10-CM | POA: Diagnosis not present

## 2020-10-26 DIAGNOSIS — E669 Obesity, unspecified: Secondary | ICD-10-CM

## 2020-10-26 DIAGNOSIS — Z0000 Encounter for general adult medical examination without abnormal findings: Secondary | ICD-10-CM | POA: Diagnosis not present

## 2020-10-27 ENCOUNTER — Encounter: Payer: Self-pay | Admitting: Family Medicine

## 2020-10-27 DIAGNOSIS — R7309 Other abnormal glucose: Secondary | ICD-10-CM

## 2020-10-27 DIAGNOSIS — E1169 Type 2 diabetes mellitus with other specified complication: Secondary | ICD-10-CM

## 2020-10-27 DIAGNOSIS — E785 Hyperlipidemia, unspecified: Secondary | ICD-10-CM | POA: Insufficient documentation

## 2020-10-27 DIAGNOSIS — E119 Type 2 diabetes mellitus without complications: Secondary | ICD-10-CM | POA: Insufficient documentation

## 2020-10-27 HISTORY — DX: Hyperlipidemia, unspecified: E78.5

## 2020-10-27 HISTORY — DX: Other abnormal glucose: R73.09

## 2020-10-27 HISTORY — DX: Type 2 diabetes mellitus without complications: E11.9

## 2020-10-27 HISTORY — DX: Type 2 diabetes mellitus with other specified complication: E11.69

## 2020-10-27 LAB — COMPREHENSIVE METABOLIC PANEL
ALT: 11 IU/L (ref 0–32)
AST: 18 IU/L (ref 0–40)
Albumin/Globulin Ratio: 1.2 (ref 1.2–2.2)
Albumin: 4.1 g/dL (ref 3.8–4.8)
Alkaline Phosphatase: 64 IU/L (ref 44–121)
BUN/Creatinine Ratio: 15 (ref 9–23)
BUN: 10 mg/dL (ref 6–24)
Bilirubin Total: 0.4 mg/dL (ref 0.0–1.2)
CO2: 22 mmol/L (ref 20–29)
Calcium: 9.4 mg/dL (ref 8.7–10.2)
Chloride: 102 mmol/L (ref 96–106)
Creatinine, Ser: 0.68 mg/dL (ref 0.57–1.00)
GFR calc Af Amer: 122 mL/min/{1.73_m2} (ref >59)
GFR calc non Af Amer: 106 mL/min/{1.73_m2} (ref >59)
Globulin, Total: 3.4 g/dL (ref 1.5–4.5)
Glucose: 98 mg/dL (ref 65–99)
Potassium: 3.7 mmol/L (ref 3.5–5.2)
Sodium: 139 mmol/L (ref 134–144)
Total Protein: 7.5 g/dL (ref 6.0–8.5)

## 2020-10-27 LAB — T3: T3, Total: 87 ng/dL (ref 71–180)

## 2020-10-27 LAB — LIPID PANEL
Chol/HDL Ratio: 5.7 ratio — ABNORMAL HIGH (ref 0.0–4.4)
Cholesterol, Total: 241 mg/dL — ABNORMAL HIGH (ref 100–199)
HDL: 42 mg/dL (ref >39)
LDL Chol Calc (NIH): 175 mg/dL — ABNORMAL HIGH (ref 0–99)
Triglycerides: 132 mg/dL (ref 0–149)
VLDL Cholesterol Cal: 24 mg/dL (ref 5–40)

## 2020-10-27 LAB — HEMOGLOBIN A1C
Est. average glucose Bld gHb Est-mCnc: 143 mg/dL
Hgb A1c MFr Bld: 6.6 % — ABNORMAL HIGH (ref 4.8–5.6)

## 2020-10-27 LAB — HEPATITIS C ANTIBODY: Hep C Virus Ab: <0.1 s/co ratio (ref 0.0–0.9)

## 2020-10-27 LAB — TSH: TSH: 0.998 u[IU]/mL (ref 0.450–4.500)

## 2020-10-27 LAB — T4, FREE: Free T4: 0.98 ng/dL (ref 0.82–1.77)

## 2020-10-27 NOTE — Progress Notes (Signed)
Please call her to schedule a visit to come in in 1 to 2 weeks to discuss new onset diabetes and other labs.  In the meantime, I do recommend that she cut back on sugar, carbohydrates such as potatoes, pasta, bread, rice and increase her physical activity.

## 2020-11-11 ENCOUNTER — Other Ambulatory Visit: Payer: Self-pay

## 2020-11-11 ENCOUNTER — Encounter: Payer: Self-pay | Admitting: Family Medicine

## 2020-11-11 ENCOUNTER — Ambulatory Visit (INDEPENDENT_AMBULATORY_CARE_PROVIDER_SITE_OTHER): Payer: BC Managed Care – PPO | Admitting: Family Medicine

## 2020-11-11 VITALS — BP 120/62 | HR 69 | Wt 203.2 lb

## 2020-11-11 DIAGNOSIS — E119 Type 2 diabetes mellitus without complications: Secondary | ICD-10-CM

## 2020-11-11 DIAGNOSIS — E1169 Type 2 diabetes mellitus with other specified complication: Secondary | ICD-10-CM | POA: Diagnosis not present

## 2020-11-11 DIAGNOSIS — E785 Hyperlipidemia, unspecified: Secondary | ICD-10-CM | POA: Diagnosis not present

## 2020-11-11 DIAGNOSIS — E669 Obesity, unspecified: Secondary | ICD-10-CM | POA: Diagnosis not present

## 2020-11-11 MED ORDER — CONTOUR NEXT TEST VI STRP: ORAL_STRIP | 1 refills | Status: AC

## 2020-11-11 MED ORDER — ROSUVASTATIN CALCIUM 10 MG PO TABS: 10.0000 mg | ORAL_TABLET | Freq: Every day | ORAL | 1 refills | Status: DC

## 2020-11-11 MED ORDER — MICROLET LANCETS MISC: 1 refills | Status: AC

## 2020-11-11 NOTE — Patient Instructions (Addendum)
Go to the American Diabetes Association website or the American Academy of Family Physicians website to look up information regarding standards of care for diabetes.   You can check your blood sugars 2-3 times per week fasting or 2 hours after a meal.  Goal fasting blood sugars are 80-120 2-hour postmeal blood sugar goal 130-160.  Continue cutting back on carbohydrates such as potatoes, pasta, bread, rice and limiting sugary drinks. Try to increase your physical activity  Start the cholesterol medication I sent to your pharmacy.  Let me know if you are having any concerns or questions. Return in 6 weeks for a lab visit to check your fasting cholesterol so nothing to eat or drink for at least 6 hours  I will see you back in 3 months

## 2020-11-11 NOTE — Progress Notes (Signed)
   Subjective:    Patient ID: Anne Burton, female    DOB: [birth year], 44 y.o.   MRN: [id number]  HPI Chief Complaint  Patient presents with  . follow-up on labs    Follow-up on labs   She is here to discuss abnormal labs.   Hgb A1c 6.6% on 10/26/2020 and she has new onset diabetes.  States her mother and other family members have diabetes.  States since finding out about her new diagnosis, she has made significant dietary changes. States she has cut back on fast foods. Drinking more water and less sugary drinks.  States she is working on increasing physical activity.  Requests referral to nutritionist for further assistance with food choices  LDL 175 with HDL 42 and trigs 132.    Hysterectomy     Review of Systems Pertinent positives and negatives in the history of present illness.     Objective:   Physical Exam BP 120/62   Pulse 69   Wt 203 lb 3.2 oz (92.2 kg)   LMP 05/23/2014 (Approximate)   BMI 37.77 kg/m   Alert and oriented and in no acute distress.  Not otherwise examined.      Assessment & Plan:  New onset type 2 diabetes mellitus (HCC) - Plan: Amb ref to Medical Nutrition Therapy-MNT  Hyperlipidemia associated with type 2 diabetes mellitus (HCC) - Plan: rosuvastatin (CRESTOR) 10 MG tablet, Lipid panel, Amb ref to Medical Nutrition Therapy-MNT  Obesity (BMI 30-39.9) - Plan: Amb ref to Medical Nutrition Therapy-MNT  In-depth counseling on the diabetes spectrum.  Congratulated her on making healthy diet changes already.  I will refer her to MNT for further assistance. Counseling on how to check blood sugars, timing of blood sugar checks and goal ranges.  She would like to check her blood sugars at home.  Provided with a meter and testing supplies Counseling on standards of care for patients with diabetes including statin therapy.  Her LDL was significantly elevated at 175.  She is agreeable to starting on a statin at this time. Discussed potential risks for  heart disease, renal disease and other chronic health conditions associated with diabetes and hyperlipidemia. She will start on statin and follow-up in 6 weeks for fasting lipids. She will work on diet and exercise for the next 3 months to lower her blood sugars. Follow-up with me in 3 months in office or sooner if needed.  She will bring in her blood sugar readings

## 2020-11-15 ENCOUNTER — Other Ambulatory Visit: Payer: Self-pay

## 2020-11-15 ENCOUNTER — Encounter: Payer: Self-pay | Admitting: Dietician

## 2020-11-15 ENCOUNTER — Encounter: Payer: BC Managed Care – PPO | Attending: Family Medicine | Admitting: Dietician

## 2020-11-15 DIAGNOSIS — E119 Type 2 diabetes mellitus without complications: Secondary | ICD-10-CM | POA: Diagnosis not present

## 2020-11-15 NOTE — Progress Notes (Signed)
Patient was seen on 11/15/2020 for the first of a series of three diabetes self-management courses at [facility].  Patient Education Plan per assessed needs and concerns is to attend three course education program for Diabetes Self Management Education.  The following learning objectives were met by the patient during this class:  Describe diabetes, types of diabetes and pathophysiology  State some common risk factors for diabetes  Defines the role of glucose and insulin  Describe the relationship between diabetes and cardiovascular and other risks  State the members of the Healthcare Team  States the rationale for glucose monitoring and when to test  State their individual Potter the importance of logging glucose readings and how to interpret the readings  Identifies A1C target  Explain the correlation between A1c and eAG values  State symptoms and treatment of high blood glucose and low blood glucose  Explain proper technique for glucose testing and identify proper sharps disposal  Handouts given during class include:  How to Thrive:  A Guide for Your Journey with Diabetes by the ADA  Meal Plan Card and carbohydrate content list  Dietary intake form  Low Sodium Flavoring Tips  Types of Fats  Dining Out  Label reading  Snack list  Planning a balanced meal  The diabetes portion plate  Diabetes Resources  A1c to eAG Conversion Chart  Blood Glucose Log  Diabetes Recommended Care Schedule  Support Group  Diabetes Success Plan  Core Class Satisfaction Survey   Follow-Up Plan:  Attend core 2

## 2020-11-22 ENCOUNTER — Encounter: Payer: BC Managed Care – PPO | Admitting: Neurology

## 2020-11-22 DIAGNOSIS — Z20822 Contact with and (suspected) exposure to covid-19: Secondary | ICD-10-CM | POA: Diagnosis not present

## 2020-11-29 ENCOUNTER — Ambulatory Visit: Payer: BC Managed Care – PPO

## 2020-12-05 DIAGNOSIS — Z20822 Contact with and (suspected) exposure to covid-19: Secondary | ICD-10-CM | POA: Diagnosis not present

## 2020-12-23 ENCOUNTER — Other Ambulatory Visit: Payer: BC Managed Care – PPO

## 2020-12-23 DIAGNOSIS — E785 Hyperlipidemia, unspecified: Secondary | ICD-10-CM | POA: Diagnosis not present

## 2020-12-23 DIAGNOSIS — E1169 Type 2 diabetes mellitus with other specified complication: Secondary | ICD-10-CM

## 2020-12-24 LAB — LIPID PANEL
Chol/HDL Ratio: 5 ratio — ABNORMAL HIGH (ref 0.0–4.4)
Cholesterol, Total: 207 mg/dL — ABNORMAL HIGH (ref 100–199)
HDL: 41 mg/dL (ref >39)
LDL Chol Calc (NIH): 147 mg/dL — ABNORMAL HIGH (ref 0–99)
Triglycerides: 103 mg/dL (ref 0–149)
VLDL Cholesterol Cal: 19 mg/dL (ref 5–40)

## 2020-12-25 NOTE — Progress Notes (Signed)
Her LDL improved but still significantly elevated. Please ask her to take two of her 10 mg Crestor tablets (20 mg) daily and we will follow up at her upcoming appointment. Also, cut back on foods high in fat.

## 2021-01-17 ENCOUNTER — Other Ambulatory Visit: Payer: Self-pay

## 2021-01-17 ENCOUNTER — Ambulatory Visit (AMBULATORY_SURGERY_CENTER): Payer: BC Managed Care – PPO

## 2021-01-17 VITALS — Ht 61.5 in | Wt 193.0 lb

## 2021-01-17 DIAGNOSIS — Z1211 Encounter for screening for malignant neoplasm of colon: Secondary | ICD-10-CM

## 2021-01-17 MED ORDER — PEG 3350-KCL-NA BICARB-NACL 420 G PO SOLR: 4000.0000 mL | Freq: Once | ORAL | 0 refills | Status: AC

## 2021-01-17 NOTE — Progress Notes (Signed)
Due to the COVID-19 pandemic we are asking patients to follow certain guidelines.  Pt aware of COVID protocols and [facility] guidelines   Patient's pre-visit was done today over the phone with the patient due to COVID-19 pandemic. Name,DOB and address verified. Insurance verified. Patient denies any allergies to Eggs and Soy. Patient denies any problems with anesthesia/sedation. Patient denies taking diet pills or blood thinners. Packet of Prep instructions mailed to patient including a copy of a consent form and pre-procedure patient acknowledgement form (with envelope to mail back to us)-pt is aware. Patient understands to call us back with any questions or concerns. The patient is COVID-19 fully vaccinated, per patient. Patient is aware of our care-partner policy and Covid-19 safety protocol. EMMI education assigned to the patient for the procedure, sent to MyChart.

## 2021-01-25 ENCOUNTER — Encounter: Payer: Self-pay | Admitting: Gastroenterology

## 2021-01-31 ENCOUNTER — Ambulatory Visit (AMBULATORY_SURGERY_CENTER): Payer: BC Managed Care – PPO | Admitting: Gastroenterology

## 2021-01-31 ENCOUNTER — Other Ambulatory Visit: Payer: Self-pay

## 2021-01-31 ENCOUNTER — Encounter: Payer: Self-pay | Admitting: Gastroenterology

## 2021-01-31 VITALS — BP 113/70 | HR 65 | Temp 97.3°F | Resp 12 | Ht 61.0 in | Wt 193.0 lb

## 2021-01-31 DIAGNOSIS — Z1211 Encounter for screening for malignant neoplasm of colon: Secondary | ICD-10-CM | POA: Diagnosis not present

## 2021-01-31 MED ORDER — SODIUM CHLORIDE 0.9 % IV SOLN: 500.0000 mL | Freq: Once | INTRAVENOUS | Status: AC

## 2021-01-31 NOTE — Op Note (Signed)
[facility] Patient Name: Anne Burton Procedure Date: 01/31/2021 9:37 AM MRN: [id number] Endoscopist: Rachael Fee , MD Age: 45 Referring MD:  Date of Birth: 10-13-1976 Gender: Female Account #: [id number] Procedure:                Colonoscopy Indications:              Screening for colorectal malignant neoplasm Medicines:                Monitored Anesthesia Care Procedure:                Pre-Anesthesia Assessment:                           - Prior to the procedure, a History and Physical                            was performed, and patient medications and                            allergies were reviewed. The patient's tolerance of                            previous anesthesia was also reviewed. The risks                            and benefits of the procedure and the sedation                            options and risks were discussed with the patient.                            All questions were answered, and informed consent                            was obtained. Prior Anticoagulants: The patient has                            taken no previous anticoagulant or antiplatelet                            agents. ASA Grade Assessment: II - A patient with                            mild systemic disease. After reviewing the risks                            and benefits, the patient was deemed in                            satisfactory condition to undergo the procedure.                           After obtaining informed consent, the colonoscope  was passed under direct vision. Throughout the                            procedure, the patient's blood pressure, pulse, and                            oxygen saturations were monitored continuously. The                            Colonoscope was introduced through the anus and                            advanced to the the cecum, identified by                            appendiceal orifice and  ileocecal valve. The                            colonoscopy was performed without difficulty. The                            patient tolerated the procedure well. The quality                            of the bowel preparation was good. The ileocecal                            valve, appendiceal orifice, and rectum were                            photographed. Scope In: 9:42:16 AM Scope Out: 9:51:35 AM Scope Withdrawal Time: 0 hours 6 minutes 21 seconds  Total Procedure Duration: 0 hours 9 minutes 19 seconds  Findings:                 The entire examined colon appeared normal on direct                            and retroflexion views. Complications:            No immediate complications. Estimated blood loss:                            None. Estimated Blood Loss:     Estimated blood loss: none. Impression:               - The entire examined colon is normal on direct and                            retroflexion views.                           - No polyps or cancers. Recommendation:           - Patient has a contact number available for  emergencies. The signs and symptoms of potential                            delayed complications were discussed with the                            patient. Return to normal activities tomorrow.                            Written discharge instructions were provided to the                            patient.                           - Resume previous diet.                           - Continue present medications.                           - Repeat colonoscopy in 10 years for screening. Rachael Fee, MD 01/31/2021 9:53:34 AM This report has been signed electronically.

## 2021-01-31 NOTE — Progress Notes (Signed)
C.W. vital signs. 

## 2021-01-31 NOTE — Progress Notes (Signed)
Pt's states no medical or surgical changes since previsit or office visit. 

## 2021-01-31 NOTE — Progress Notes (Signed)
pt tolerated well. VSS. awake and to recovery. Report given to RN.  

## 2021-01-31 NOTE — Patient Instructions (Signed)
YOU HAD AN ENDOSCOPIC PROCEDURE TODAY AT THE [facility]:   Refer to the procedure report that was given to you for any specific questions about what was found during the examination.  If the procedure report does not answer your questions, please call your gastroenterologist to clarify.  If you requested that your care partner not be given the details of your procedure findings, then the procedure report has been included in a sealed envelope for you to review at your convenience later.  YOU SHOULD EXPECT: Some feelings of bloating in the abdomen. Passage of more gas than usual.  Walking can help get rid of the air that was put into your GI tract during the procedure and reduce the bloating. If you had a lower endoscopy (such as a colonoscopy or flexible sigmoidoscopy) you may notice spotting of blood in your stool or on the toilet paper. If you underwent a bowel prep for your procedure, you may not have a normal bowel movement for a few days.  Please Note:  You might notice some irritation and congestion in your nose or some drainage.  This is from the oxygen used during your procedure.  There is no need for concern and it should clear up in a day or so.  SYMPTOMS TO REPORT IMMEDIATELY:  Following lower endoscopy (colonoscopy or flexible sigmoidoscopy):  Excessive amounts of blood in the stool  Significant tenderness or worsening of abdominal pains  Swelling of the abdomen that is new, acute  Fever of 100F or higher   For urgent or emergent issues, a gastroenterologist can be reached at any hour by calling [phone]. Do not use MyChart messaging for urgent concerns.    DIET:  We do recommend a small meal at first, but then you may proceed to your regular diet.  Drink plenty of fluids but you should avoid alcoholic beverages for 24 hours.  MEDICATIONS: Continue present medications.  Thank you for allowing us to provide for your healthcare needs today.  ACTIVITY:  You  should plan to take it easy for the rest of today and you should NOT DRIVE or use heavy machinery until tomorrow (because of the sedation medicines used during the test).    FOLLOW UP: Our staff will call the number listed on your records 48-72 hours following your procedure to check on you and address any questions or concerns that you may have regarding the information given to you following your procedure. If we do not reach you, we will leave a message.  We will attempt to reach you two times.  During this call, we will ask if you have developed any symptoms of COVID 19. If you develop any symptoms (ie: fever, flu-like symptoms, shortness of breath, cough etc.) before then, please call [phone].  If you test positive for Covid 19 in the 2 weeks post procedure, please call and report this information to us.    If any biopsies were taken you will be contacted by phone or by letter within the next 1-3 weeks.  Please call us at [phone] if you have not heard about the biopsies in 3 weeks.    SIGNATURES/CONFIDENTIALITY: You and/or your care partner have signed paperwork which will be entered into your electronic medical record.  These signatures attest to the fact that that the information above on your After Visit Summary has been reviewed and is understood.  Full responsibility of the confidentiality of this discharge information lies with you and/or your care-partner.  

## 2021-02-02 ENCOUNTER — Telehealth: Payer: Self-pay

## 2021-02-02 NOTE — Telephone Encounter (Signed)
  Follow up Call-  Call back number 01/31/2021  Post procedure Call Back phone  # [phone]  Permission to leave phone message Yes  Some recent data might be hidden     Patient questions:  Do you have a fever, pain , or abdominal swelling? No. Pain Score  0 *  Have you tolerated food without any problems? Yes.    Have you been able to return to your normal activities? Yes.    Do you have any questions about your discharge instructions: Diet   No. Medications  No. Follow up visit  No.  Do you have questions or concerns about your Care? No.  Actions: * If pain score is 4 or above: No action needed, pain <4.  1. Have you developed a fever since your procedure? no  2.   Have you had an respiratory symptoms (SOB or cough) since your procedure? no  3.   Have you tested positive for COVID 19 since your procedure no  4.   Have you had any family members/close contacts diagnosed with the COVID 19 since your procedure?  no   If yes to any of these questions please route to Laverna Peace, RN and Karlton Lemon, RN

## 2021-02-08 ENCOUNTER — Ambulatory Visit: Payer: BC Managed Care – PPO | Admitting: Family Medicine

## 2021-02-13 ENCOUNTER — Ambulatory Visit: Payer: BC Managed Care – PPO | Admitting: Registered"

## 2021-03-09 ENCOUNTER — Ambulatory Visit: Payer: BC Managed Care – PPO | Admitting: Family Medicine

## 2021-03-09 NOTE — Progress Notes (Deleted)
  Subjective:    Patient ID: Anne Burton, female    DOB: 1976/08/16, 45 y.o.   MRN: [id number]  Anne Burton is a 45 y.o. female who presents for follow-up of Type 2 diabetes mellitus.  Patient {is/are not:32546} checking home blood sugars.   Home blood sugar records: {dm home sugars:14018} How often is blood sugars being checked: *** Current symptoms include: {dm sx:14075}. Patient denies {dm sx:19199}.  Patient {is/are not:32546} checking their feet daily. Any Foot concerns (callous, ulcer, wound, thickened nails, toenail fungus, skin fungus, hammer toe): *** Last dilated eye exam: ***  Current treatments: {dm interventions:14074}. Medication compliance: {good/fair/poor:33178}  Current diet: {diet habits:16563} Current exercise: {exercise types:16438} Known diabetic complications: {diabetes complications:1215}  The following portions of the patient's history were reviewed and updated as appropriate: allergies, current medications, past medical history, past social history and problem list.  ROS as in subjective above.     Objective:    Physical Exam Alert and in no distress otherwise not examined.  Last menstrual period 05/23/2014.  Lab Review Diabetic Labs Latest Ref Rng & Units 12/23/2020 10/26/2020 09/16/2020 12/29/2016  HbA1c 4.8 - 5.6 % - 6.6(H) - -  Chol 100 - 199 mg/dL 607(P) 710(G) - -  HDL >39 mg/dL 41 42 - -  Calc LDL 0 - 99 mg/dL 269(S) 854(O) - -  Triglycerides 0 - 149 mg/dL 270 350 - -  Creatinine 0.57 - 1.00 mg/dL - 0.93 8.18 2.99   BP/Weight 01/31/2021 01/17/2021 11/15/2020 11/11/2020 10/26/2020  Systolic BP 113 - - 120 120  Diastolic BP 70 - - 62 68  Wt. (Lbs) 193 193 203 203.2 209.4  BMI 36.47 35.88 37.74 37.77 38.93   No flowsheet data found.  Anne Burton  reports that she has never smoked. She has never used smokeless tobacco. She reports that she does not drink alcohol and does not use drugs.     Assessment & Plan:    New onset type 2 diabetes mellitus  (HCC)  Hyperlipidemia associated with type 2 diabetes mellitus (HCC)  1. Rx changes: {none:33079} 2. Education: Reviewed 'ABCs' of diabetes management (respective goals in parentheses):  A1C (<7), blood pressure (<130/80), and cholesterol (LDL <100). 3. Compliance at present is estimated to be {good/fair/poor:33178}. Efforts to improve compliance (if necessary) will be directed at {compliance:16716}. 4. Follow up: {NUMBERS; 0-10:33138} {time:11}

## 2021-03-13 ENCOUNTER — Encounter: Payer: Self-pay | Admitting: Family Medicine

## 2021-04-19 ENCOUNTER — Encounter: Payer: Self-pay | Admitting: Internal Medicine

## 2021-06-07 ENCOUNTER — Telehealth: Payer: Self-pay | Admitting: Family Medicine

## 2021-06-07 NOTE — Telephone Encounter (Signed)
Dismissal letter in guarantor snapshot  °

## 2021-07-25 ENCOUNTER — Ambulatory Visit: Payer: BC Managed Care – PPO

## 2022-08-22 DIAGNOSIS — S161XXA Strain of muscle, fascia and tendon at neck level, initial encounter: Secondary | ICD-10-CM | POA: Diagnosis not present

## 2022-08-22 DIAGNOSIS — M542 Cervicalgia: Secondary | ICD-10-CM | POA: Diagnosis not present

## 2022-08-22 DIAGNOSIS — M545 Low back pain, unspecified: Secondary | ICD-10-CM | POA: Diagnosis not present

## 2022-08-24 DIAGNOSIS — M545 Low back pain, unspecified: Secondary | ICD-10-CM | POA: Diagnosis not present

## 2022-08-24 DIAGNOSIS — M62838 Other muscle spasm: Secondary | ICD-10-CM | POA: Diagnosis not present

## 2022-10-21 DIAGNOSIS — R051 Acute cough: Secondary | ICD-10-CM | POA: Diagnosis not present

## 2022-10-21 DIAGNOSIS — J029 Acute pharyngitis, unspecified: Secondary | ICD-10-CM | POA: Diagnosis not present

## 2022-10-21 DIAGNOSIS — J019 Acute sinusitis, unspecified: Secondary | ICD-10-CM | POA: Diagnosis not present

## 2022-10-21 DIAGNOSIS — J040 Acute laryngitis: Secondary | ICD-10-CM | POA: Diagnosis not present

## 2022-12-06 DIAGNOSIS — E7800 Pure hypercholesterolemia, unspecified: Secondary | ICD-10-CM | POA: Diagnosis not present

## 2022-12-06 DIAGNOSIS — R7303 Prediabetes: Secondary | ICD-10-CM | POA: Diagnosis not present

## 2022-12-06 DIAGNOSIS — E559 Vitamin D deficiency, unspecified: Secondary | ICD-10-CM | POA: Diagnosis not present

## 2022-12-06 DIAGNOSIS — R5382 Chronic fatigue, unspecified: Secondary | ICD-10-CM | POA: Diagnosis not present

## 2022-12-06 DIAGNOSIS — N951 Menopausal and female climacteric states: Secondary | ICD-10-CM | POA: Diagnosis not present

## 2022-12-06 DIAGNOSIS — D649 Anemia, unspecified: Secondary | ICD-10-CM | POA: Diagnosis not present

## 2022-12-10 DIAGNOSIS — Z6837 Body mass index (BMI) 37.0-37.9, adult: Secondary | ICD-10-CM | POA: Diagnosis not present

## 2022-12-10 DIAGNOSIS — N951 Menopausal and female climacteric states: Secondary | ICD-10-CM | POA: Diagnosis not present

## 2022-12-10 DIAGNOSIS — N898 Other specified noninflammatory disorders of vagina: Secondary | ICD-10-CM | POA: Diagnosis not present

## 2022-12-10 DIAGNOSIS — Z1331 Encounter for screening for depression: Secondary | ICD-10-CM | POA: Diagnosis not present

## 2022-12-10 DIAGNOSIS — Z1339 Encounter for screening examination for other mental health and behavioral disorders: Secondary | ICD-10-CM | POA: Diagnosis not present

## 2022-12-10 DIAGNOSIS — R6882 Decreased libido: Secondary | ICD-10-CM | POA: Diagnosis not present

## 2022-12-17 DIAGNOSIS — E559 Vitamin D deficiency, unspecified: Secondary | ICD-10-CM | POA: Diagnosis not present

## 2023-01-15 DIAGNOSIS — Z6837 Body mass index (BMI) 37.0-37.9, adult: Secondary | ICD-10-CM | POA: Diagnosis not present

## 2023-01-15 DIAGNOSIS — E785 Hyperlipidemia, unspecified: Secondary | ICD-10-CM | POA: Diagnosis not present

## 2023-01-22 DIAGNOSIS — E7800 Pure hypercholesterolemia, unspecified: Secondary | ICD-10-CM | POA: Diagnosis not present

## 2023-01-22 DIAGNOSIS — Z6837 Body mass index (BMI) 37.0-37.9, adult: Secondary | ICD-10-CM | POA: Diagnosis not present

## 2023-02-11 DIAGNOSIS — Z6836 Body mass index (BMI) 36.0-36.9, adult: Secondary | ICD-10-CM | POA: Diagnosis not present

## 2023-02-11 DIAGNOSIS — R7303 Prediabetes: Secondary | ICD-10-CM | POA: Diagnosis not present

## 2023-02-21 DIAGNOSIS — E7800 Pure hypercholesterolemia, unspecified: Secondary | ICD-10-CM | POA: Diagnosis not present

## 2023-02-21 DIAGNOSIS — Z6836 Body mass index (BMI) 36.0-36.9, adult: Secondary | ICD-10-CM | POA: Diagnosis not present

## 2023-03-07 DIAGNOSIS — Z6836 Body mass index (BMI) 36.0-36.9, adult: Secondary | ICD-10-CM | POA: Diagnosis not present

## 2023-03-07 DIAGNOSIS — R5382 Chronic fatigue, unspecified: Secondary | ICD-10-CM | POA: Diagnosis not present

## 2023-03-07 DIAGNOSIS — N951 Menopausal and female climacteric states: Secondary | ICD-10-CM | POA: Diagnosis not present

## 2023-03-07 DIAGNOSIS — E785 Hyperlipidemia, unspecified: Secondary | ICD-10-CM | POA: Diagnosis not present

## 2023-04-03 DIAGNOSIS — E7800 Pure hypercholesterolemia, unspecified: Secondary | ICD-10-CM | POA: Diagnosis not present

## 2023-04-03 DIAGNOSIS — Z6835 Body mass index (BMI) 35.0-35.9, adult: Secondary | ICD-10-CM | POA: Diagnosis not present

## 2023-05-13 DIAGNOSIS — E559 Vitamin D deficiency, unspecified: Secondary | ICD-10-CM | POA: Diagnosis not present

## 2023-05-13 DIAGNOSIS — Z6836 Body mass index (BMI) 36.0-36.9, adult: Secondary | ICD-10-CM | POA: Diagnosis not present

## 2023-08-19 DIAGNOSIS — Z111 Encounter for screening for respiratory tuberculosis: Secondary | ICD-10-CM | POA: Diagnosis not present

## 2023-08-21 DIAGNOSIS — Z111 Encounter for screening for respiratory tuberculosis: Secondary | ICD-10-CM | POA: Diagnosis not present

## 2023-08-21 DIAGNOSIS — Z6836 Body mass index (BMI) 36.0-36.9, adult: Secondary | ICD-10-CM | POA: Diagnosis not present

## 2023-08-21 DIAGNOSIS — Z021 Encounter for pre-employment examination: Secondary | ICD-10-CM | POA: Diagnosis not present

## 2023-12-11 ENCOUNTER — Encounter: Payer: Self-pay | Admitting: Internal Medicine

## 2023-12-11 ENCOUNTER — Ambulatory Visit: Payer: BC Managed Care – PPO | Admitting: Internal Medicine

## 2023-12-11 VITALS — BP 110/72 | HR 73 | Temp 97.6°F | Ht 61.0 in | Wt 197.2 lb

## 2023-12-11 DIAGNOSIS — E1169 Type 2 diabetes mellitus with other specified complication: Secondary | ICD-10-CM

## 2023-12-11 DIAGNOSIS — E785 Hyperlipidemia, unspecified: Secondary | ICD-10-CM

## 2023-12-11 DIAGNOSIS — E669 Obesity, unspecified: Secondary | ICD-10-CM | POA: Diagnosis not present

## 2023-12-11 DIAGNOSIS — G5603 Carpal tunnel syndrome, bilateral upper limbs: Secondary | ICD-10-CM

## 2023-12-11 DIAGNOSIS — E119 Type 2 diabetes mellitus without complications: Secondary | ICD-10-CM

## 2023-12-11 MED ORDER — MELOXICAM 15 MG PO TABS: 15.0000 mg | ORAL_TABLET | Freq: Every day | ORAL | 0 refills | Status: DC | PRN

## 2023-12-11 NOTE — Progress Notes (Signed)
[facility] [address] [place] [address] Dept: [phone] Dept Fax: [phone]  New Patient Office Visit  Subjective:   Anne Burton 1976/11/13 12/11/2023  Chief Complaint  Patient presents with   Establish Care   Hand Problem    Noticed in 2016 hand tingling was told baby carp    HPI: Adabella Burton presents today to establish care at [facility]. Introduced to Publishing rights manager role and practice setting.  All questions answered.  Concerns: See below   Discussed the use of AI scribe software for clinical note transcription with the patient, who gave verbal consent to proceed.  History of Present Illness   The patient, with a history of diabetes and high cholesterol, presents for an initial visit to establish care. They report not currently taking any medications for these conditions, and it has been several years since their last diabetes check. The patient also mentions a history of tingling and numbness in their hands, which began during their pregnancy in 2016 and has since worsened. The symptoms include swelling, pain, and numbness, particularly in the middle and ring fingers of both hands. The patient has tried various interventions, including wristbands and over-the-counter nervous system health pills, with limited relief. They also report trying to lose weight and have previously received weight loss injections with [facility] MD. She is starting a new diet and doing regular exercise.       The following portions of the patient's history were reviewed and updated as appropriate: past medical history, past surgical history, family history, social history, allergies, medications, and problem list.   Patient Active Problem List   Diagnosis Date Noted   Bilateral carpal tunnel syndrome 12/11/2023   Obesity (BMI 30-39.9) 11/11/2020   Elevated hemoglobin A1c 10/27/2020   Hyperlipidemia  associated with type 2 diabetes mellitus (HCC) 10/27/2020   New onset type 2 diabetes mellitus (HCC) 10/27/2020   Plantar fasciitis 03/03/2018   S/P TAH (total abdominal hysterectomy) 02/10/2015   Multigravida of advanced maternal age 74/23/2016   Fetal disorder 01/18/2015   Uterine scar from previous cesarean delivery, antepartum 01/18/2015   History of cesarean section 12/07/2014   Not immune to rubella 12/02/2014   Supervision of high risk pregnancy due to social problems in third trimester 12/01/2014   Past Medical History:  Diagnosis Date   Allergy    Anemia    Elevated hemoglobin A1c 10/27/2020   Hyperlipidemia    Hyperlipidemia associated with type 2 diabetes mellitus (HCC) 10/27/2020   New onset type 2 diabetes mellitus (HCC) 10/27/2020   Pelvic pain    hydrosalpynx   Past Surgical History:  Procedure Laterality Date   ABDOMINAL HYSTERECTOMY     done with 2016 C/S Surgery-general anesthesia   CESAREAN SECTION  1994,1997,2004,2016   x 4   LAPAROSCOPIC BILATERAL SALPINGECTOMY N/A 03/11/2018   Procedure: OPERATIVE LAPAROSCOPY WITH EXTENSIVE LYSIS OF ADHESIONS;  Surgeon: Silverio Lay, MD;  Location: [facility];  Service: Gynecology;  Laterality: N/A;   LAPAROSCOPIC LYSIS OF ADHESIONS N/A 03/11/2018   Procedure: LAPAROSCOPIC LYSIS OF ADHESIONS;  Surgeon: Silverio Lay, MD;  Location: [facility];  Service: Gynecology;  Laterality: N/A;   Family History  Problem Relation Age of Onset   Hypertension Mother    Diabetes Mother    Heart attack Mother 91   Heart attack Father 71   Breast cancer Maternal Grandmother    Stomach cancer Maternal Grandfather    Colon cancer Neg Hx  Colon polyps Neg Hx    Esophageal cancer Neg Hx    Rectal cancer Neg Hx     Current Outpatient Medications:    acetaminophen (TYLENOL) 325 MG tablet, Take 650 mg by mouth every 6 (six) hours as needed., Disp: , Rfl:    meloxicam (MOBIC) 15 MG tablet, Take 1 tablet (15 mg total) by mouth daily as needed for  pain., Disp: 30 tablet, Rfl: 0   glucose blood (CONTOUR NEXT TEST) test strip, Test once a day. Pt uses contour next one meter (Patient not taking: Reported on 12/11/2023), Disp: 100 each, Rfl: 1   Microlet Lancets MISC, Test once a day. Pt uses contour next one meter (Patient not taking: Reported on 12/11/2023), Disp: 100 each, Rfl: 1   rosuvastatin (CRESTOR) 10 MG tablet, Take 1 tablet (10 mg total) by mouth daily. (Patient not taking: Reported on 12/11/2023), Disp: 90 tablet, Rfl: 1  Current Facility-Administered Medications:    0.9 %  sodium chloride infusion, 500 mL, Intravenous, Once, Rachael Fee, MD No Known Allergies  ROS: A complete ROS was performed with pertinent positives/negatives noted in the HPI. The remainder of the ROS are negative.   Objective:   Today's Vitals   12/11/23 1453  BP: 110/72  Pulse: 73  Temp: 97.6 F (36.4 C)  TempSrc: Temporal  SpO2: 94%  Weight: 197 lb 3.2 oz (89.4 kg)  Height: 5\' 1"  (1.549 m)    GENERAL: Well-appearing, in NAD. Well nourished.  SKIN: Pink, warm and dry. No rash, lesion, ulceration, or ecchymoses.  NECK: Trachea midline. Full ROM w/o pain or tenderness. No lymphadenopathy.  RESPIRATORY: Chest wall symmetrical. Respirations even and non-labored. Breath sounds clear to auscultation bilaterally.  CARDIAC: S1, S2 present, regular rate and rhythm. Peripheral pulses 2+ bilaterally.  MSK: Muscle tone and strength appropriate for age. Joints w/o tenderness, redness, or swelling. 5/5 grip strength bilaterally. (+)tinel and phalen sign bilaterally EXTREMITIES: Without clubbing, cyanosis, or edema.  NEUROLOGIC: No motor or sensory deficits. Steady, even gait.  PSYCH/MENTAL STATUS: Alert, oriented x 3. Cooperative, appropriate mood and affect.   Health Maintenance Due  Topic Date Due   FOOT EXAM  Never done   Diabetic kidney evaluation - Urine ACR  Never done   DTaP/Tdap/Td (1 - Tdap) Never done   HEMOGLOBIN A1C  04/26/2021   Diabetic  kidney evaluation - eGFR measurement  10/26/2021    No results found for any visits on 12/11/23.  Assessment & Plan:  Assessment and Plan    Carpal Tunnel Syndrome Bilateral hand numbness, tingling, and pain, primarily in the middle and ring fingers. Symptoms have been present for several years and have been worsening. Non-pharmacological interventions (wristbands, heat pads) have provided some relief but symptoms persist. -Refer to orthopedics for consultation and possible steroid injection or nerve conduction testing. -Start on a short course of anti-inflammatory medication. -Recommend use of wrist splints at night and possibly during the day. -Recommend use of ice to help reduce inflammation.  Diabetes Mellitus History of HbA1c (6.6) two years ago, but no current treatment or recent monitoring. -Check HbA1c today. -Plan for fasting blood work on 12/16/2023 to check cholesterol and recheck HbA1c. - patient is not fasting today  Hyperlipidemia History of high cholesterol, but not currently on treatment. -Plan for fasting blood work on 12/16/2023 to check cholesterol and recheck HbA1c.  Obesity Expressed interest in weight loss strategies. Would like to discuss this at future visits.  - continue healthy diet and regular exercise -Discuss  diet, exercise, and possible weight loss treatments at next visit in three months.        Orders Placed This Encounter  Procedures   Hemoglobin A1C    Standing Status:   Future    Expiration Date:   12/10/2024   Comp Met (CMET)    Standing Status:   Future    Expiration Date:   12/10/2024   Lipid panel    Standing Status:   Future    Expiration Date:   12/10/2024   Ambulatory referral to Orthopedic Surgery    Referral Priority:   Routine    Referral Type:   Surgical    Referral Reason:   Specialty Services Required    Requested Specialty:   Orthopedic Surgery    Number of Visits Requested:   1   Meds ordered this encounter  Medications    meloxicam (MOBIC) 15 MG tablet    Sig: Take 1 tablet (15 mg total) by mouth daily as needed for pain.    Dispense:  30 tablet    Refill:  0    Supervising Provider:   Garnette Gunner [[id number]]    Return in about 3 months (around 03/10/2024) for Chronic Condition follow up.   Salvatore Decent, FNP

## 2023-12-11 NOTE — Patient Instructions (Addendum)
  Wrist splint for carpal tunnel "cockup" splint   Return for fasting lab work

## 2023-12-16 ENCOUNTER — Other Ambulatory Visit (INDEPENDENT_AMBULATORY_CARE_PROVIDER_SITE_OTHER): Payer: BC Managed Care – PPO

## 2023-12-16 DIAGNOSIS — E785 Hyperlipidemia, unspecified: Secondary | ICD-10-CM

## 2023-12-16 DIAGNOSIS — E119 Type 2 diabetes mellitus without complications: Secondary | ICD-10-CM

## 2023-12-16 DIAGNOSIS — E1169 Type 2 diabetes mellitus with other specified complication: Secondary | ICD-10-CM | POA: Diagnosis not present

## 2023-12-16 LAB — COMPREHENSIVE METABOLIC PANEL
ALT: 7 U/L (ref 0–35)
AST: 19 U/L (ref 0–37)
Albumin: 3.9 g/dL (ref 3.5–5.2)
Alkaline Phosphatase: 49 U/L (ref 39–117)
BUN: 9 mg/dL (ref 6–23)
CO2: 25 meq/L (ref 19–32)
Calcium: 9.1 mg/dL (ref 8.4–10.5)
Chloride: 107 meq/L (ref 96–112)
Creatinine, Ser: 0.52 mg/dL (ref 0.40–1.20)
GFR: 109.52 mL/min (ref >60.00)
Glucose, Bld: 98 mg/dL (ref 70–99)
Potassium: 3.7 meq/L (ref 3.5–5.1)
Sodium: 139 meq/L (ref 135–145)
Total Bilirubin: 0.5 mg/dL (ref 0.2–1.2)
Total Protein: 7.5 g/dL (ref 6.0–8.3)

## 2023-12-16 LAB — LIPID PANEL
Cholesterol: 199 mg/dL (ref 0–200)
HDL: 43.7 mg/dL (ref >39.00)
LDL Cholesterol: 134 mg/dL — ABNORMAL HIGH (ref 0–99)
NonHDL: 155.37
Total CHOL/HDL Ratio: 5
Triglycerides: 105 mg/dL (ref 0.0–149.0)
VLDL: 21 mg/dL (ref 0.0–40.0)

## 2023-12-16 LAB — HEMOGLOBIN A1C: Hgb A1c MFr Bld: 6.8 % — ABNORMAL HIGH (ref 4.6–6.5)

## 2023-12-18 ENCOUNTER — Other Ambulatory Visit: Payer: Self-pay | Admitting: Internal Medicine

## 2023-12-18 DIAGNOSIS — E119 Type 2 diabetes mellitus without complications: Secondary | ICD-10-CM

## 2023-12-18 DIAGNOSIS — E1169 Type 2 diabetes mellitus with other specified complication: Secondary | ICD-10-CM

## 2023-12-18 MED ORDER — METFORMIN HCL ER 500 MG PO TB24: 500.0000 mg | ORAL_TABLET | Freq: Every day | ORAL | 1 refills | Status: DC

## 2023-12-18 MED ORDER — ROSUVASTATIN CALCIUM 10 MG PO TABS: 10.0000 mg | ORAL_TABLET | Freq: Every day | ORAL | 1 refills | Status: DC

## 2023-12-23 DIAGNOSIS — E119 Type 2 diabetes mellitus without complications: Secondary | ICD-10-CM | POA: Diagnosis not present

## 2023-12-23 LAB — HM DIABETES EYE EXAM

## 2023-12-24 ENCOUNTER — Encounter: Payer: Self-pay | Admitting: Orthopaedic Surgery

## 2023-12-24 ENCOUNTER — Ambulatory Visit (INDEPENDENT_AMBULATORY_CARE_PROVIDER_SITE_OTHER): Payer: BC Managed Care – PPO | Admitting: Orthopaedic Surgery

## 2023-12-24 DIAGNOSIS — G5601 Carpal tunnel syndrome, right upper limb: Secondary | ICD-10-CM

## 2023-12-24 DIAGNOSIS — G5602 Carpal tunnel syndrome, left upper limb: Secondary | ICD-10-CM | POA: Diagnosis not present

## 2023-12-24 DIAGNOSIS — G5603 Carpal tunnel syndrome, bilateral upper limbs: Secondary | ICD-10-CM

## 2023-12-24 MED ORDER — LIDOCAINE HCL 1 % IJ SOLN
1.0000 mL | INTRAMUSCULAR | Status: AC | PRN
  Administered 2023-12-24: 1 mL

## 2023-12-24 MED ORDER — BUPIVACAINE HCL 0.5 % IJ SOLN
1.0000 mL | INTRAMUSCULAR | Status: AC | PRN
  Administered 2023-12-24: 1 mL

## 2023-12-24 MED ORDER — METHYLPREDNISOLONE ACETATE 40 MG/ML IJ SUSP
40.0000 mg | INTRAMUSCULAR | Status: AC | PRN
  Administered 2023-12-24: 40 mg

## 2023-12-24 NOTE — Progress Notes (Signed)
Office Visit Note   Patient: Anne Burton           Date of Birth: [birth year]           MRN: [id number] Visit Date: 12/24/2023              Requested by: Anne Decent, FNP [address] [place],  [place] [address] PCP: Anne Decent, FNP   Assessment & Plan: Visit Diagnoses:  1. Right carpal tunnel syndrome   2. Left carpal tunnel syndrome     Plan: Impression is bilateral carpal tunnel syndrome.  Treatment options were given and disease process explained.  She would like to try left carpal tunnel injection today.  She tolerated this well.  Will see her back as needed.  Follow-Up Instructions: No follow-ups on file.   Orders:  No orders of the defined types were placed in this encounter.  No orders of the defined types were placed in this encounter.     Procedures: Hand/UE Inj: L carpal tunnel for carpal tunnel syndrome on 12/24/2023 2:51 PM Details: 25 G needle Medications: 1 mL lidocaine 1 %; 1 mL bupivacaine 0.5 %; 40 mg methylPREDNISolone acetate 40 MG/ML Outcome: tolerated well, no immediate complications Patient was prepped and draped in the usual sterile fashion.       Clinical Data: No additional findings.   Subjective: Chief Complaint  Patient presents with   Left Wrist - Pain   Right Wrist - Pain    HPI Patient is a very pleasant 48 year old female here for evaluation of bilateral carpal tunnel syndrome.  Referral from PCP.  Patient developed carpal tunnel syndrome in 2016 from pregnancy.  Her symptoms improved slightly but then has recently come back.  She is right-hand-dominant.  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  Her nighttime symptoms are worse in the daytime.  Braces help temporarily and partially. Review of Systems  Constitutional: Negative.   HENT: Negative.    Eyes: Negative.   Respiratory: Negative.    Cardiovascular: Negative.   Endocrine: Negative.   Musculoskeletal: Negative.   Neurological: Negative.    Hematological: Negative.   Psychiatric/Behavioral: Negative.    All other systems reviewed and are negative.    Objective: Vital Signs: LMP 05/23/2014 (Approximate)   Physical Exam Vitals and nursing note reviewed.  Constitutional:      Appearance: She is well-developed.  HENT:     Head: Atraumatic.     Nose: Nose normal.  Eyes:     Extraocular Movements: Extraocular movements intact.  Cardiovascular:     Pulses: Normal pulses.  Pulmonary:     Effort: Pulmonary effort is normal.  Abdominal:     Palpations: Abdomen is soft.  Musculoskeletal:     Cervical back: Neck supple.  Skin:    General: Skin is warm.     Capillary Refill: Capillary refill takes less than 2 seconds.  Neurological:     Mental Status: She is alert. Mental status is at baseline.  Psychiatric:        Behavior: Behavior normal.        Thought Content: Thought content normal.        Judgment: Judgment normal.     Right Hand Exam   Range of Motion  The patient has normal right wrist ROM.   Muscle Strength  The patient has normal right wrist strength.  Tests  Phalen's sign: positive Tinel's sign (median nerve): positive Finkelstein's test: positive   Left Hand Exam  Range of Motion  The patient has normal left wrist ROM.  Muscle Strength  The patient has normal left wrist strength.  Tests  Phalen's sign: positive Tinel's sign (median nerve): positive Finkelstein's test: positive     Specialty Comments:  No specialty comments available.  Imaging: No results found.   PMFS History: Patient Active Problem List   Diagnosis Date Noted   Bilateral carpal tunnel syndrome 12/11/2023   Obesity (BMI 30-39.9) 11/11/2020   Elevated hemoglobin A1c 10/27/2020   Hyperlipidemia associated with type 2 diabetes mellitus (HCC) 10/27/2020   New onset type 2 diabetes mellitus (HCC) 10/27/2020   Plantar fasciitis 03/03/2018   S/P TAH (total abdominal hysterectomy) 02/10/2015   Multigravida  of advanced maternal age 41/23/2016   Fetal disorder 01/18/2015   Uterine scar from previous cesarean delivery, antepartum 01/18/2015   History of cesarean section 12/07/2014   Not immune to rubella 12/02/2014   Supervision of high risk pregnancy due to social problems in third trimester 12/01/2014   Past Medical History:  Diagnosis Date   Allergy    Anemia    Elevated hemoglobin A1c 10/27/2020   Hyperlipidemia    Hyperlipidemia associated with type 2 diabetes mellitus (HCC) 10/27/2020   New onset type 2 diabetes mellitus (HCC) 10/27/2020   Pelvic pain    hydrosalpynx    Family History  Problem Relation Age of Onset   Hypertension Mother    Diabetes Mother    Heart attack Mother 63   Heart attack Father 14   Breast cancer Maternal Grandmother    Stomach cancer Maternal Grandfather    Colon cancer Neg Hx    Colon polyps Neg Hx    Esophageal cancer Neg Hx    Rectal cancer Neg Hx     Past Surgical History:  Procedure Laterality Date   ABDOMINAL HYSTERECTOMY     done with 2016 C/S Surgery-general anesthesia   CESAREAN SECTION  [phone]   x 4   LAPAROSCOPIC BILATERAL SALPINGECTOMY N/A 03/11/2018   Procedure: OPERATIVE LAPAROSCOPY WITH EXTENSIVE LYSIS OF ADHESIONS;  Surgeon: Silverio Lay, MD;  Location: [facility];  Service: Gynecology;  Laterality: N/A;   LAPAROSCOPIC LYSIS OF ADHESIONS N/A 03/11/2018   Procedure: LAPAROSCOPIC LYSIS OF ADHESIONS;  Surgeon: Silverio Lay, MD;  Location: [facility];  Service: Gynecology;  Laterality: N/A;   Social History   Occupational History   Not on file  Tobacco Use   Smoking status: Never   Smokeless tobacco: Never  Vaping Use   Vaping status: Never Used  Substance and Sexual Activity   Alcohol use: No   Drug use: No   Sexual activity: Yes    Birth control/protection: None

## 2024-03-10 ENCOUNTER — Ambulatory Visit (INDEPENDENT_AMBULATORY_CARE_PROVIDER_SITE_OTHER): Payer: BC Managed Care – PPO | Admitting: Internal Medicine

## 2024-03-10 ENCOUNTER — Encounter: Payer: Self-pay | Admitting: Internal Medicine

## 2024-03-10 VITALS — BP 110/72 | HR 77 | Temp 97.8°F | Ht 61.0 in | Wt 194.4 lb

## 2024-03-10 DIAGNOSIS — E119 Type 2 diabetes mellitus without complications: Secondary | ICD-10-CM

## 2024-03-10 DIAGNOSIS — Z7984 Long term (current) use of oral hypoglycemic drugs: Secondary | ICD-10-CM

## 2024-03-10 DIAGNOSIS — R232 Flushing: Secondary | ICD-10-CM

## 2024-03-10 DIAGNOSIS — E1169 Type 2 diabetes mellitus with other specified complication: Secondary | ICD-10-CM

## 2024-03-10 DIAGNOSIS — E785 Hyperlipidemia, unspecified: Secondary | ICD-10-CM

## 2024-03-10 LAB — POCT GLYCOSYLATED HEMOGLOBIN (HGB A1C): Hemoglobin A1C: 6.1 % — AB (ref 4.0–5.6)

## 2024-03-10 NOTE — Progress Notes (Signed)
 [facility] [address] [place] [address] Dept: [phone] Dept Fax: [phone]    Subjective:   Anne Burton 05-Feb-1976 03/10/2024  Chief Complaint  Patient presents with   Follow-up    Referral ortho for steroid shot     HPI: Anne Burton presents today for re-assessment and management of chronic medical conditions.  Discussed the use of AI scribe software for clinical note transcription with the patient, who gave verbal consent to proceed.  History of Present Illness   The patient, with a history of diabetes, is following up on her condition. She has been taking metformin 500mg  once daily. A1C has decrease from 6.8 to 6.1% today. She is trying to maintain a healthy diet.  She has hx of HLD, is compliant with rosuvastatin therapy.   In addition to diabetes, the patient has been dealing with carpal tunnel syndrome in her left hand. She received a steroid shot by orthopedics which initially caused discomfort but eventually led to improvement. She is now considering getting the same treatment for her right hand.  The patient also reports experiencing hot flashes and cold night sweats, suspecting that she might be going through menopause. She had a hysterectomy due to placenta accreta during delivery and has been experiencing mood swings and increased appetite, which she is concerned might be related to her menopausal symptoms or her hysterectomy.  Lastly, the patient expresses concern about her weight, noting that she often craves food and eats frequently. She is unsure if this is related to her hysterectomy or diabetes.        The following portions of the patient's history were reviewed and updated as appropriate: past medical history, past surgical history, family history, social history, allergies, medications, and problem list.   Patient Active Problem List   Diagnosis Date Noted   Bilateral carpal tunnel  syndrome 12/11/2023   Obesity (BMI 30-39.9) 11/11/2020   Elevated hemoglobin A1c 10/27/2020   Hyperlipidemia associated with type 2 diabetes mellitus (HCC) 10/27/2020   New onset type 2 diabetes mellitus (HCC) 10/27/2020   Plantar fasciitis 03/03/2018   S/P TAH (total abdominal hysterectomy) 02/10/2015   Multigravida of advanced maternal age 62/23/2016   Fetal disorder 01/18/2015   Uterine scar from previous cesarean delivery, antepartum 01/18/2015   History of cesarean section 12/07/2014   Not immune to rubella 12/02/2014   Supervision of high risk pregnancy due to social problems in third trimester 12/01/2014   Past Medical History:  Diagnosis Date   Allergy    Anemia    Elevated hemoglobin A1c 10/27/2020   Hyperlipidemia    Hyperlipidemia associated with type 2 diabetes mellitus (HCC) 10/27/2020   New onset type 2 diabetes mellitus (HCC) 10/27/2020   Pelvic pain    hydrosalpynx   Past Surgical History:  Procedure Laterality Date   ABDOMINAL HYSTERECTOMY     done with 2016 C/S Surgery-general anesthesia   CESAREAN SECTION  1994,1997,2004,2016   x 4   LAPAROSCOPIC BILATERAL SALPINGECTOMY N/A 03/11/2018   Procedure: OPERATIVE LAPAROSCOPY WITH EXTENSIVE LYSIS OF ADHESIONS;  Surgeon: Silverio Lay, MD;  Location: [facility];  Service: Gynecology;  Laterality: N/A;   LAPAROSCOPIC LYSIS OF ADHESIONS N/A 03/11/2018   Procedure: LAPAROSCOPIC LYSIS OF ADHESIONS;  Surgeon: Silverio Lay, MD;  Location: [facility];  Service: Gynecology;  Laterality: N/A;   Family History  Problem Relation Age of Onset   Hypertension Mother    Diabetes Mother    Heart attack Mother 49  Heart attack Father 64   Breast cancer Maternal Grandmother    Stomach cancer Maternal Grandfather    Colon cancer Neg Hx    Colon polyps Neg Hx    Esophageal cancer Neg Hx    Rectal cancer Neg Hx     Current Outpatient Medications:    acetaminophen (TYLENOL) 325 MG tablet, Take 650 mg by mouth every 6 (six) hours as  needed., Disp: , Rfl:    glucose blood (CONTOUR NEXT TEST) test strip, Test once a day. Pt uses contour next one meter, Disp: 100 each, Rfl: 1   meloxicam (MOBIC) 15 MG tablet, Take 1 tablet (15 mg total) by mouth daily as needed for pain., Disp: 30 tablet, Rfl: 0   metFORMIN (GLUCOPHAGE-XR) 500 MG 24 hr tablet, Take 1 tablet (500 mg total) by mouth daily with breakfast., Disp: 90 tablet, Rfl: 1   Microlet Lancets MISC, Test once a day. Pt uses contour next one meter, Disp: 100 each, Rfl: 1   rosuvastatin (CRESTOR) 10 MG tablet, Take 1 tablet (10 mg total) by mouth daily., Disp: 90 tablet, Rfl: 1  Current Facility-Administered Medications:    0.9 %  sodium chloride infusion, 500 mL, Intravenous, Once, Janel Medford, MD No Known Allergies   ROS: A complete ROS was performed with pertinent positives/negatives noted in the HPI. The remainder of the ROS are negative.    Objective:   Today's Vitals   03/10/24 1537  BP: 110/72  Pulse: 77  Temp: 97.8 F (36.6 C)  TempSrc: Temporal  SpO2: 96%  Weight: 194 lb 6.4 oz (88.2 kg)  Height: 5\' 1"  (1.549 m)    GENERAL: Well-appearing, in NAD. Well nourished.  SKIN: Pink, warm and dry. No rash, lesion, ulceration, or ecchymoses.  NECK: Trachea midline. Full ROM w/o pain or tenderness. No lymphadenopathy.  RESPIRATORY: Chest wall symmetrical. Respirations even and non-labored. Breath sounds clear to auscultation bilaterally.  CARDIAC: S1, S2 present, regular rate and rhythm. Peripheral pulses 2+ bilaterally.  EXTREMITIES: Without clubbing, cyanosis, or edema.  NEUROLOGIC: No motor or sensory deficits. Steady, even gait. Sensory exam of the foot is normal, tested with the monofilament. Good pulses, no lesions or ulcers, good peripheral pulses. PSYCH/MENTAL STATUS: Alert, oriented x 3. Cooperative, appropriate mood and affect.   Health Maintenance Due  Topic Date Due   Diabetic kidney evaluation - Urine ACR  Never done   DTaP/Tdap/Td (1 -  Tdap) Never done    Results for orders placed or performed in visit on 03/10/24  POCT glycosylated hemoglobin (Hb A1C)  Result Value Ref Range   Hemoglobin A1C 6.1 (A) 4.0 - 5.6 %   HbA1c POC (<> result, manual entry)     HbA1c, POC (prediabetic range)     HbA1c, POC (controlled diabetic range)      The 10-year ASCVD risk score (Arnett DK, et al., 2019) is: 3.1%     Assessment & Plan:  Assessment and Plan    Type 2 Diabetes Mellitus HbA1c improved to 6.1%, indicating effective glycemic control with current management. - Continue metformin XR 500 mg once daily. - Check kidney function and microalbumin. - Perform foot examination. - Follow-up in four months.  Weight Management Weight gain and increased appetite possibly due to dietary habits and menopausal changes. Emphasized high-protein diet. - Advise consuming at least 30 grams of protein per meal to help with satiety.  - Recommend protein-rich foods such as grilled chicken, tuna, salmon, and nuts.  Hot Flashes Experiencing hot flashes  and night sweats. Discussed non-hormonal management and lifestyle modifications. - Provide information on non-hormonal options and lifestyle modifications. - Advise wearing lightweight clothing and using fans. - Discuss potential dietary triggers such as caffeine.  Carpal Tunnel Syndrome Left wrist symptoms improved post-steroid injection. Ready for right wrist treatment. - Provide contact information for orthopedist to schedule right wrist steroid injection.       Orders Placed This Encounter  Procedures   Microalbumin / creatinine urine ratio   Basic Metabolic Panel (BMET)   POCT glycosylated hemoglobin (Hb A1C)   No images are attached to the encounter or orders placed in the encounter. No orders of the defined types were placed in this encounter.   Return in about 4 months (around 07/10/2024) for diabetes, cholesterol, health maintenance .   Gavin Kast, FNP

## 2024-03-10 NOTE — Patient Instructions (Addendum)
 Orthopedic Information  [facility] [address] [place], [place] [address] Phone: [phone]   MENOPAUSAL RESOURCES:   Herbal supplements, vitamins, and foods: Black Cohosh Usual dosage: 20 to 40 mg PO once or twice daily (using standardized, proprietary extracts of black cohosh). Max studied: 127 mg/day PO  2. Evening Primrose Oil Take 4g per day along with Vitamin E 80mg  per day  3. Maca Root Take as directed on packaging.   4. Phytoestrogens have both estrogenic and antiestrogenic properties and are categorized as isoflavones, coumestans, or lignans. Two types of isoflavones, genistein and daidzein, are found in soybeans, chickpeas, and lentils and are thought to be the most potent estrogens of the phytoestrogens (although they are much weaker than human estrogens). Lignans (eg, enterolactone and enterodiol) are found in flaxseed, lentils, grains, fruits, and vegetables.    Non-pharmacological alternative therapies:  Cognitive-behavioral therapy, clinical hypnosis, weight loss, yoga, acupuncture, chiropractic intervention Avoid triggers  Loose, light weight clothing  Decrease caffeine consumption  Avoid alcohol and spicy foods as these may trigger symptoms

## 2024-03-11 ENCOUNTER — Encounter: Payer: Self-pay | Admitting: Internal Medicine

## 2024-03-11 LAB — BASIC METABOLIC PANEL WITH GFR
BUN: 11 mg/dL (ref 6–23)
CO2: 29 meq/L (ref 19–32)
Calcium: 9.7 mg/dL (ref 8.4–10.5)
Chloride: 102 meq/L (ref 96–112)
Creatinine, Ser: 0.66 mg/dL (ref 0.40–1.20)
GFR: 103.23 mL/min (ref >60.00)
Glucose, Bld: 85 mg/dL (ref 70–99)
Potassium: 3.9 meq/L (ref 3.5–5.1)
Sodium: 138 meq/L (ref 135–145)

## 2024-03-24 ENCOUNTER — Ambulatory Visit (INDEPENDENT_AMBULATORY_CARE_PROVIDER_SITE_OTHER)

## 2024-03-24 ENCOUNTER — Encounter: Payer: Self-pay | Admitting: Internal Medicine

## 2024-03-24 ENCOUNTER — Ambulatory Visit (INDEPENDENT_AMBULATORY_CARE_PROVIDER_SITE_OTHER): Admitting: Internal Medicine

## 2024-03-24 VITALS — BP 114/68 | HR 75 | Temp 98.0°F | Ht 61.0 in | Wt 196.2 lb

## 2024-03-24 DIAGNOSIS — M25551 Pain in right hip: Secondary | ICD-10-CM

## 2024-03-24 MED ORDER — MELOXICAM 7.5 MG PO TABS: 7.5000 mg | ORAL_TABLET | Freq: Two times a day (BID) | ORAL | 0 refills | Status: AC

## 2024-03-24 NOTE — Patient Instructions (Signed)
 Alternate ice and heat packs  Can continue lidocaine  patch if needed  Take Meloxicam  7.5mg  two times a day for 14 days  Do stretch  Go for xray

## 2024-03-24 NOTE — Progress Notes (Signed)
 [facility] [address] [place] [address] Dept: [phone] Dept Fax: [phone]  Acute Care Office Visit  Subjective:   Anne Burton Apr 20, 1976 03/24/2024  Chief Complaint  Patient presents with   Hip Pain    Right hip pain started 3 months ago getting worse    HPI: Discussed the use of AI scribe software for clinical note transcription with the patient, who gave verbal consent to proceed.  History of Present Illness   Anne Burton is a 48 year old female who presents with right heel pain for three months.  She has been experiencing right hip pain for the past three months, described as tender and dull, without radiation. The pain worsens when laying on it, touching it, or sitting for long periods. She uses a lidocaine  patch and arthritis cream, which provide some relief but do not completely alleviate the pain.   She denies any recent injury.   The following portions of the patient's history were reviewed and updated as appropriate: past medical history, past surgical history, family history, social history, allergies, medications, and problem list.   Patient Active Problem List   Diagnosis Date Noted   Bilateral carpal tunnel syndrome 12/11/2023   Obesity (BMI 30-39.9) 11/11/2020   Elevated hemoglobin A1c 10/27/2020   Hyperlipidemia associated with type 2 diabetes mellitus (HCC) 10/27/2020   New onset type 2 diabetes mellitus (HCC) 10/27/2020   Plantar fasciitis 03/03/2018   S/P TAH (total abdominal hysterectomy) 02/10/2015   Multigravida of advanced maternal age 67/23/2016   Fetal disorder 01/18/2015   Uterine scar from previous cesarean delivery, antepartum 01/18/2015   History of cesarean section 12/07/2014   Not immune to rubella 12/02/2014   Supervision of high risk pregnancy due to social problems in third trimester 12/01/2014   Past Medical History:  Diagnosis Date   Allergy    Anemia     Elevated hemoglobin A1c 10/27/2020   Hyperlipidemia    Hyperlipidemia associated with type 2 diabetes mellitus (HCC) 10/27/2020   New onset type 2 diabetes mellitus (HCC) 10/27/2020   Pelvic pain    hydrosalpynx   Past Surgical History:  Procedure Laterality Date   ABDOMINAL HYSTERECTOMY     done with 2016 C/S Surgery-general anesthesia   CESAREAN SECTION  1994,1997,2004,2016   x 4   LAPAROSCOPIC BILATERAL SALPINGECTOMY N/A 03/11/2018   Procedure: OPERATIVE LAPAROSCOPY WITH EXTENSIVE LYSIS OF ADHESIONS;  Surgeon: Ona Bidding, MD;  Location: [facility];  Service: Gynecology;  Laterality: N/A;   LAPAROSCOPIC LYSIS OF ADHESIONS N/A 03/11/2018   Procedure: LAPAROSCOPIC LYSIS OF ADHESIONS;  Surgeon: Ona Bidding, MD;  Location: [facility];  Service: Gynecology;  Laterality: N/A;   Family History  Problem Relation Age of Onset   Hypertension Mother    Diabetes Mother    Heart attack Mother 24   Heart attack Father 70   Breast cancer Maternal Grandmother    Stomach cancer Maternal Grandfather    Colon cancer Neg Hx    Colon polyps Neg Hx    Esophageal cancer Neg Hx    Rectal cancer Neg Hx     Current Outpatient Medications:    acetaminophen  (TYLENOL ) 325 MG tablet, Take 650 mg by mouth every 6 (six) hours as needed., Disp: , Rfl:    glucose blood (CONTOUR NEXT TEST) test strip, Test once a day. Pt uses contour next one meter, Disp: 100 each, Rfl: 1   meloxicam  (MOBIC ) 7.5 MG tablet, Take 1 tablet (7.5 mg total)  by mouth 2 (two) times daily for 14 days., Disp: 28 tablet, Rfl: 0   metFORMIN  (GLUCOPHAGE -XR) 500 MG 24 hr tablet, Take 1 tablet (500 mg total) by mouth daily with breakfast., Disp: 90 tablet, Rfl: 1   Microlet Lancets MISC, Test once a day. Pt uses contour next one meter, Disp: 100 each, Rfl: 1   rosuvastatin  (CRESTOR ) 10 MG tablet, Take 1 tablet (10 mg total) by mouth daily., Disp: 90 tablet, Rfl: 1  Current Facility-Administered Medications:    0.9 %  sodium chloride  infusion,  500 mL, Intravenous, Once, Janel Medford, MD No Known Allergies   ROS: A complete ROS was performed with pertinent positives/negatives noted in the HPI. The remainder of the ROS are negative.    Objective:   Today's Vitals   03/24/24 1453  BP: 114/68  Pulse: 75  Temp: 98 F (36.7 C)  TempSrc: Temporal  SpO2: 95%  Weight: 196 lb 3.2 oz (89 kg)  Height: 5\' 1"  (1.549 m)    GENERAL: Well-appearing, in NAD. Well nourished.  SKIN: Pink, warm and dry. No rash, lesion, ulceration, or ecchymoses.  RESPIRATORY: Chest wall symmetrical. Respirations even and non-labored.  CARDIAC:  Peripheral pulses 2+ bilaterally.  MSK: Muscle tone and strength appropriate for age. Localized tenderness with palpation to R. Hip at trochanteric area.  No pain with internal or external rotation.  EXTREMITIES: Without clubbing, cyanosis, or edema.  NEUROLOGIC: No motor or sensory deficits. Steady, even gait.  PSYCH/MENTAL STATUS: Alert, oriented x 3. Cooperative, appropriate mood and affect.    No results found for any visits on 03/24/24.    Assessment & Plan:  Assessment and Plan    Right hip pain Chronic right hip pain for three months. Partial relief with lidocaine  and arthritis cream.  - Prescribe meloxicam  7.5 mg twice daily for 14 days. - Instruct to apply ice and heat packs alternately. - Continue lidocaine  patch as needed. - Perform stretching exercises as demonstrated. - Order x-ray of the right hip. - Refer to orthopedics if no improvement after two weeks.       Meds ordered this encounter  Medications   meloxicam  (MOBIC ) 7.5 MG tablet    Sig: Take 1 tablet (7.5 mg total) by mouth 2 (two) times daily for 14 days.    Dispense:  28 tablet    Refill:  0    Supervising Provider:   THOMPSON, AARON B [[id number]]   Orders Placed This Encounter  Procedures   DG HIP UNILAT W OR W/O PELVIS 2-3 VIEWS RIGHT    Standing Status:   Future    Expiration Date:   03/24/2025    Reason for Exam  (SYMPTOM  OR DIAGNOSIS REQUIRED):   hip pain x 3 months    Is patient pregnant?:   No    Preferred imaging location?:   Internal    Radiology Contrast Protocol - do NOT remove file path:   [email or web address]    Release to patient:   Immediate   Lab Orders  No laboratory test(s) ordered today   No images are attached to the encounter or orders placed in the encounter.  Return if symptoms worsen or fail to improve.   Gavin Kast, FNP

## 2024-03-31 ENCOUNTER — Encounter: Payer: Self-pay | Admitting: Orthopaedic Surgery

## 2024-03-31 ENCOUNTER — Ambulatory Visit (INDEPENDENT_AMBULATORY_CARE_PROVIDER_SITE_OTHER): Admitting: Orthopaedic Surgery

## 2024-03-31 DIAGNOSIS — G5601 Carpal tunnel syndrome, right upper limb: Secondary | ICD-10-CM

## 2024-03-31 MED ORDER — METHYLPREDNISOLONE ACETATE 40 MG/ML IJ SUSP
40.0000 mg | INTRAMUSCULAR | Status: AC | PRN
  Administered 2024-03-31: 40 mg

## 2024-03-31 MED ORDER — BUPIVACAINE HCL 0.5 % IJ SOLN
1.0000 mL | INTRAMUSCULAR | Status: AC | PRN
  Administered 2024-03-31: 1 mL

## 2024-03-31 MED ORDER — LIDOCAINE HCL 1 % IJ SOLN
1.0000 mL | INTRAMUSCULAR | Status: AC | PRN
  Administered 2024-03-31: 1 mL

## 2024-03-31 NOTE — Progress Notes (Signed)
 Office Visit Note   Patient: Anne Burton           Date of Birth: 07-14-1976           MRN: [id number] Visit Date: 03/31/2024              Requested by: Gavin Kast, FNP [address] [place],  [place] [address] PCP: Gavin Kast, FNP   Assessment & Plan: Visit Diagnoses:  1. Right carpal tunnel syndrome    History of Present Illness Shykeria Hy is a 48 year old female who presents with right hand pain due to carpal tunnel syndrome.  She experiences increased pain and discomfort in her right hand, significantly affecting her daily activities. There have been no recent diagnostic studies or treatments for her current symptoms. She previously had a successful carpal tunnel injection in her left hand in January, which alleviated her symptoms. She recalls undergoing nerve studies several years ago, but no recent interventions have been conducted for her right hand.  Exam of the right hand is unchanged from prior visit.  Assessment and Plan Carpal tunnel syndrome Increased symptoms in right hand. Positive response to left hand injection supports similar treatment for right hand. - Administer right carpal tunnel injection.  Follow-Up Instructions: No follow-ups on file.   Orders:  No orders of the defined types were placed in this encounter.  No orders of the defined types were placed in this encounter.     Procedures: Hand/UE Inj: R carpal tunnel for carpal tunnel syndrome on 03/31/2024 11:57 AM Indications: pain Details: 25 G needle Medications: 1 mL lidocaine  1 %; 1 mL bupivacaine  0.5 %; 40 mg methylPREDNISolone  acetate 40 MG/ML Outcome: tolerated well, no immediate complications Patient was prepped and draped in the usual sterile fashion.       Clinical Data: No additional findings.   Subjective: Chief Complaint  Patient presents with   Right Wrist - Pain    HPI  Review of Systems  Constitutional: Negative.   HENT: Negative.    Eyes: Negative.    Respiratory: Negative.    Cardiovascular: Negative.   Endocrine: Negative.   Musculoskeletal: Negative.   Neurological: Negative.   Hematological: Negative.   Psychiatric/Behavioral: Negative.    All other systems reviewed and are negative.    Objective: Vital Signs: LMP 05/23/2014 (Approximate)   Physical Exam Vitals and nursing note reviewed.  Constitutional:      Appearance: She is well-developed.  HENT:     Head: Normocephalic and atraumatic.  Pulmonary:     Effort: Pulmonary effort is normal.  Abdominal:     Palpations: Abdomen is soft.  Musculoskeletal:     Cervical back: Neck supple.  Skin:    General: Skin is warm.     Capillary Refill: Capillary refill takes less than 2 seconds.  Neurological:     Mental Status: She is alert and oriented to person, place, and time.  Psychiatric:        Behavior: Behavior normal.        Thought Content: Thought content normal.        Judgment: Judgment normal.      PMFS History: Patient Active Problem List   Diagnosis Date Noted   Bilateral carpal tunnel syndrome 12/11/2023   Obesity (BMI 30-39.9) 11/11/2020   Elevated hemoglobin A1c 10/27/2020   Hyperlipidemia associated with type 2 diabetes mellitus (HCC) 10/27/2020   New onset type 2 diabetes mellitus (HCC) 10/27/2020   Plantar fasciitis 03/03/2018   S/P TAH (  total abdominal hysterectomy) 02/10/2015   Multigravida of advanced maternal age 50/23/2016   Fetal disorder 01/18/2015   Uterine scar from previous cesarean delivery, antepartum 01/18/2015   History of cesarean section 12/07/2014   Not immune to rubella 12/02/2014   Supervision of high risk pregnancy due to social problems in third trimester 12/01/2014   Past Medical History:  Diagnosis Date   Allergy    Anemia    Elevated hemoglobin A1c 10/27/2020   Hyperlipidemia    Hyperlipidemia associated with type 2 diabetes mellitus (HCC) 10/27/2020   New onset type 2 diabetes mellitus (HCC) 10/27/2020   Pelvic  pain    hydrosalpynx    Family History  Problem Relation Age of Onset   Hypertension Mother    Diabetes Mother    Heart attack Mother 48   Heart attack Father 80   Breast cancer Maternal Grandmother    Stomach cancer Maternal Grandfather    Colon cancer Neg Hx    Colon polyps Neg Hx    Esophageal cancer Neg Hx    Rectal cancer Neg Hx     Past Surgical History:  Procedure Laterality Date   ABDOMINAL HYSTERECTOMY     done with 2016 C/S Surgery-general anesthesia   CESAREAN SECTION  [id number]   x 4   LAPAROSCOPIC BILATERAL SALPINGECTOMY N/A 03/11/2018   Procedure: OPERATIVE LAPAROSCOPY WITH EXTENSIVE LYSIS OF ADHESIONS;  Surgeon: Ona Bidding, MD;  Location: [facility];  Service: Gynecology;  Laterality: N/A;   LAPAROSCOPIC LYSIS OF ADHESIONS N/A 03/11/2018   Procedure: LAPAROSCOPIC LYSIS OF ADHESIONS;  Surgeon: Ona Bidding, MD;  Location: [facility];  Service: Gynecology;  Laterality: N/A;   Social History   Occupational History   Not on file  Tobacco Use   Smoking status: Never   Smokeless tobacco: Never  Vaping Use   Vaping status: Never Used  Substance and Sexual Activity   Alcohol use: No   Drug use: No   Sexual activity: Yes    Birth control/protection: None

## 2024-04-08 ENCOUNTER — Ambulatory Visit: Payer: Self-pay | Admitting: Internal Medicine

## 2024-06-29 ENCOUNTER — Other Ambulatory Visit: Payer: Self-pay | Admitting: Internal Medicine

## 2024-06-29 DIAGNOSIS — E1169 Type 2 diabetes mellitus with other specified complication: Secondary | ICD-10-CM

## 2024-06-29 DIAGNOSIS — E119 Type 2 diabetes mellitus without complications: Secondary | ICD-10-CM

## 2024-07-09 ENCOUNTER — Ambulatory Visit: Payer: Self-pay | Admitting: Internal Medicine

## 2024-07-09 NOTE — Progress Notes (Deleted)
 [facility] [address] [place] [address] Dept: [phone] Dept Fax: [phone]    Subjective:   Anne Burton 1976/03/05 07/09/2024  No chief complaint on file.   HPI: Anne Burton presents today for re-assessment and management of chronic medical conditions.    Lab Results  Component Value Date   HGBA1C 6.1 (A) 03/10/2024   HGBA1C 6.8 (H) 12/16/2023   HGBA1C 6.6 (H) 10/26/2020   = Last lipids Lab Results  Component Value Date   CHOL 199 12/16/2023   HDL 43.70 12/16/2023   LDLCALC 134 (H) 12/16/2023   TRIG 105.0 12/16/2023   CHOLHDL 5 12/16/2023    The following portions of the patient's history were reviewed and updated as appropriate: past medical history, past surgical history, family history, social history, allergies, medications, and problem list.   Patient Active Problem List   Diagnosis Date Noted   Bilateral carpal tunnel syndrome 12/11/2023   Obesity (BMI 30-39.9) 11/11/2020   Elevated hemoglobin A1c 10/27/2020   Hyperlipidemia associated with type 2 diabetes mellitus (HCC) 10/27/2020   New onset type 2 diabetes mellitus (HCC) 10/27/2020   Plantar fasciitis 03/03/2018   S/P TAH (total abdominal hysterectomy) 02/10/2015   Multigravida of advanced maternal age 69/23/2016   Fetal disorder 01/18/2015   Uterine scar from previous cesarean delivery, antepartum 01/18/2015   History of cesarean section 12/07/2014   Not immune to rubella 12/02/2014   Supervision of high risk pregnancy due to social problems in third trimester 12/01/2014   Past Medical History:  Diagnosis Date   Allergy    Anemia    Elevated hemoglobin A1c 10/27/2020   Hyperlipidemia    Hyperlipidemia associated with type 2 diabetes mellitus (HCC) 10/27/2020   New onset type 2 diabetes mellitus (HCC) 10/27/2020   Pelvic pain    hydrosalpynx   Past Surgical History:  Procedure Laterality Date   ABDOMINAL HYSTERECTOMY      done with 2016 C/S Surgery-general anesthesia   CESAREAN SECTION  1994,1997,2004,2016   x 4   LAPAROSCOPIC BILATERAL SALPINGECTOMY N/A 03/11/2018   Procedure: OPERATIVE LAPAROSCOPY WITH EXTENSIVE LYSIS OF ADHESIONS;  Surgeon: Darcel Pool, MD;  Location: [facility];  Service: Gynecology;  Laterality: N/A;   LAPAROSCOPIC LYSIS OF ADHESIONS N/A 03/11/2018   Procedure: LAPAROSCOPIC LYSIS OF ADHESIONS;  Surgeon: Darcel Pool, MD;  Location: [facility];  Service: Gynecology;  Laterality: N/A;   Family History  Problem Relation Age of Onset   Hypertension Mother    Diabetes Mother    Heart attack Mother 41   Heart attack Father 68   Breast cancer Maternal Grandmother    Stomach cancer Maternal Grandfather    Colon cancer Neg Hx    Colon polyps Neg Hx    Esophageal cancer Neg Hx    Rectal cancer Neg Hx     Current Outpatient Medications:    acetaminophen  (TYLENOL ) 325 MG tablet, Take 650 mg by mouth every 6 (six) hours as needed., Disp: , Rfl:    glucose blood (CONTOUR NEXT TEST) test strip, Test once a day. Pt uses contour next one meter, Disp: 100 each, Rfl: 1   metFORMIN  (GLUCOPHAGE -XR) 500 MG 24 hr tablet, TAKE 1 TABLET(500 MG) BY MOUTH DAILY WITH BREAKFAST, Disp: 90 tablet, Rfl: 1   Microlet Lancets MISC, Test once a day. Pt uses contour next one meter, Disp: 100 each, Rfl: 1   rosuvastatin  (CRESTOR ) 10 MG tablet, TAKE 1 TABLET(10 MG) BY MOUTH DAILY, Disp: 90 tablet, Rfl:  1  Current Facility-Administered Medications:    0.9 %  sodium chloride  infusion, 500 mL, Intravenous, Once, Teressa Toribio SQUIBB, MD No Known Allergies   ROS: A complete ROS was performed with pertinent positives/negatives noted in the HPI. The remainder of the ROS are negative.    Objective:   There were no vitals filed for this visit.  GENERAL: Well-appearing, in NAD. Well nourished.  SKIN: Pink, warm and dry. No rash, lesion, ulceration, or ecchymoses.  NECK: Trachea midline. Full ROM w/o pain or tenderness. No  lymphadenopathy.  RESPIRATORY: Chest wall symmetrical. Respirations even and non-labored. Breath sounds clear to auscultation bilaterally.  CARDIAC: S1, S2 present, regular rate and rhythm. Peripheral pulses 2+ bilaterally.  MSK: Muscle tone and strength appropriate for age. Joints w/o tenderness, redness, or swelling.  EXTREMITIES: Without clubbing, cyanosis, or edema.  NEUROLOGIC: No motor or sensory deficits. Steady, even gait.  PSYCH/MENTAL STATUS: Alert, oriented x 3. Cooperative, appropriate mood and affect.   Health Maintenance Due  Topic Date Due   OPHTHALMOLOGY EXAM  Never done   Diabetic kidney evaluation - Urine ACR  Never done   DTaP/Tdap/Td (1 - Tdap) Never done   Pneumococcal Vaccine: 19-49 Years (1 of 2 - PCV) Never done   Hepatitis B Vaccines 19-59 Average Risk (1 of 3 - 19+ 3-dose series) Never done   COVID-19 Vaccine (3 - 2024-25 season) 07/28/2023   INFLUENZA VACCINE  06/26/2024    No results found for any visits on 07/09/24.  The 10-year ASCVD risk score (Arnett DK, et al., 2019) is: 3.6%     Assessment & Plan:    There are no diagnoses linked to this encounter. No orders of the defined types were placed in this encounter.  No images are attached to the encounter or orders placed in the encounter. No orders of the defined types were placed in this encounter.   No follow-ups on file.   Anne Senters, FNP

## 2024-07-10 ENCOUNTER — Encounter: Payer: Self-pay | Admitting: Internal Medicine

## 2024-07-14 ENCOUNTER — Ambulatory Visit: Admitting: Internal Medicine

## 2024-07-14 ENCOUNTER — Encounter: Payer: Self-pay | Admitting: Internal Medicine

## 2024-07-14 VITALS — BP 122/76 | HR 72 | Temp 98.8°F | Ht 61.5 in | Wt 208.0 lb

## 2024-07-14 DIAGNOSIS — Z7984 Long term (current) use of oral hypoglycemic drugs: Secondary | ICD-10-CM | POA: Diagnosis not present

## 2024-07-14 DIAGNOSIS — E119 Type 2 diabetes mellitus without complications: Secondary | ICD-10-CM

## 2024-07-14 DIAGNOSIS — E1169 Type 2 diabetes mellitus with other specified complication: Secondary | ICD-10-CM | POA: Diagnosis not present

## 2024-07-14 DIAGNOSIS — E785 Hyperlipidemia, unspecified: Secondary | ICD-10-CM

## 2024-07-14 DIAGNOSIS — Z7985 Long-term (current) use of injectable non-insulin antidiabetic drugs: Secondary | ICD-10-CM | POA: Diagnosis not present

## 2024-07-14 DIAGNOSIS — E669 Obesity, unspecified: Secondary | ICD-10-CM | POA: Diagnosis not present

## 2024-07-14 LAB — POCT GLYCOSYLATED HEMOGLOBIN (HGB A1C): Hemoglobin A1C: 6.4 % — AB (ref 4.0–5.6)

## 2024-07-14 MED ORDER — TIRZEPATIDE 2.5 MG/0.5ML ~~LOC~~ SOAJ: SUBCUTANEOUS | 0 refills | Status: DC

## 2024-07-14 NOTE — Progress Notes (Signed)
 [facility] [address] [place] [address] Dept: [phone] Dept Fax: [phone]    Subjective:   Anne Burton [birth year] 07/14/2024  Chief Complaint  Patient presents with   Follow-up    4 month follow up for DM  Concerned about recent weight gain     HPI: Anne Burton presents today for re-assessment and management of chronic medical conditions.  Discussed the use of AI scribe software for clinical note transcription with the patient, who gave verbal consent to proceed.  History of Present Illness   Anne Burton is a 48 year old female with type 2 diabetes who presents for a follow-up visit.  She is managing her type 2 diabetes with metformin  500mg , taken once daily, Her hemoglobin A1c has increased from 6.1 in April to 6.4 currently. She recently had an eye exam, and records are being obtained.  She is also managing hyperlipidemia with rosuvastatin  10 mg once daily. Her cholesterol levels were checked several months ago and were stable.  She is concerned about weight gain, particularly in the abdominal area, which she feels is affecting her lower back due to her job that requires a lot of standing. She experiences cravings and feels hungry even after eating. She is trying to manage her diet by eating smaller portions and snacking on fruits like grapes and oranges. She has a history of plantar fasciitis, which limits her ability to exercise as it flares up with too much activity. Her feet were swollen last week, but not today. She has seen a weight loss clinician in the past, who advised patient calorie intake of 1300-1500 calories.      Wt Readings from Last 3 Encounters:  07/14/24 208 lb (94.3 kg)  03/24/24 196 lb 3.2 oz (89 kg)  03/10/24 194 lb 6.4 oz (88.2 kg)    Lab Results  Component Value Date   HGBA1C 6.4 (A) 07/14/2024   HGBA1C 6.1 (A) 03/10/2024   HGBA1C 6.8 (H) 12/16/2023    Last lipids Lab  Results  Component Value Date   CHOL 199 12/16/2023   HDL 43.70 12/16/2023   LDLCALC 134 (H) 12/16/2023   TRIG 105.0 12/16/2023   CHOLHDL 5 12/16/2023    The following portions of the patient's history were reviewed and updated as appropriate: past medical history, past surgical history, family history, social history, allergies, medications, and problem list.   Patient Active Problem List   Diagnosis Date Noted   Bilateral carpal tunnel syndrome 12/11/2023   Obesity (BMI 30-39.9) 11/11/2020   Hyperlipidemia associated with type 2 diabetes mellitus (HCC) 10/27/2020   Type 2 diabetes mellitus without complication, without long-term current use of insulin  (HCC) 10/27/2020   Plantar fasciitis 03/03/2018   S/P TAH (total abdominal hysterectomy) 02/10/2015   Uterine scar from previous cesarean delivery, antepartum 01/18/2015   Not immune to rubella 12/02/2014   Past Medical History:  Diagnosis Date   Allergy    Anemia    Hyperlipidemia associated with type 2 diabetes mellitus (HCC) 10/27/2020   Pelvic pain    hydrosalpynx   Past Surgical History:  Procedure Laterality Date   ABDOMINAL HYSTERECTOMY     done with 2016 C/S Surgery-general anesthesia   CESAREAN SECTION  1994,1997,2004,2016   x 4   LAPAROSCOPIC BILATERAL SALPINGECTOMY N/A 03/11/2018   Procedure: OPERATIVE LAPAROSCOPY WITH EXTENSIVE LYSIS OF ADHESIONS;  Surgeon: Darcel Pool, MD;  Location: [facility];  Service: Gynecology;  Laterality: N/A;   LAPAROSCOPIC LYSIS OF ADHESIONS N/A  03/11/2018   Procedure: LAPAROSCOPIC LYSIS OF ADHESIONS;  Surgeon: Darcel Pool, MD;  Location: [facility];  Service: Gynecology;  Laterality: N/A;   Family History  Problem Relation Age of Onset   Hypertension Mother    Diabetes Mother    Heart attack Mother 71   Heart attack Father 31   Breast cancer Maternal Grandmother    Stomach cancer Maternal Grandfather    Colon cancer Neg Hx    Colon polyps Neg Hx    Esophageal cancer Neg Hx     Rectal cancer Neg Hx     Current Outpatient Medications:    acetaminophen  (TYLENOL ) 325 MG tablet, Take 650 mg by mouth every 6 (six) hours as needed., Disp: , Rfl:    glucose blood (CONTOUR NEXT TEST) test strip, Test once a day. Pt uses contour next one meter, Disp: 100 each, Rfl: 1   metFORMIN  (GLUCOPHAGE -XR) 500 MG 24 hr tablet, TAKE 1 TABLET(500 MG) BY MOUTH DAILY WITH BREAKFAST, Disp: 90 tablet, Rfl: 1   Microlet Lancets MISC, Test once a day. Pt uses contour next one meter, Disp: 100 each, Rfl: 1   rosuvastatin  (CRESTOR ) 10 MG tablet, TAKE 1 TABLET(10 MG) BY MOUTH DAILY, Disp: 90 tablet, Rfl: 1   tirzepatide  (MOUNJARO ) 2.5 MG/0.5ML Pen, Inject 2.5mg  into skin once weekly for 4 weeks., Disp: 2 mL, Rfl: 0  Current Facility-Administered Medications:    0.9 %  sodium chloride  infusion, 500 mL, Intravenous, Once, Teressa Toribio SQUIBB, MD No Known Allergies   ROS: A complete ROS was performed with pertinent positives/negatives noted in the HPI. The remainder of the ROS are negative.    Objective:   Today's Vitals   07/14/24 1441  BP: 122/76  Pulse: 72  Temp: 98.8 F (37.1 C)  TempSrc: Oral  SpO2: 98%  Weight: 208 lb (94.3 kg)  Height: 5' 1.5 (1.562 m)    GENERAL: Well-appearing, in NAD. obese SKIN: Pink, warm and dry. No rash, lesion, ulceration, or ecchymoses.  NECK: Trachea midline. Full ROM w/o pain or tenderness. No lymphadenopathy. No thyromegaly or palpable masses.  RESPIRATORY: Chest wall symmetrical. Respirations even and non-labored. Breath sounds clear to auscultation bilaterally.  CARDIAC: S1, S2 present, regular rate and rhythm. Peripheral pulses 2+ bilaterally.  EXTREMITIES: Without clubbing, cyanosis, or edema.  NEUROLOGIC: No motor or sensory deficits. Steady, even gait.  PSYCH/MENTAL STATUS: Alert, oriented x 3. Cooperative, appropriate mood and affect.   Health Maintenance Due  Topic Date Due   OPHTHALMOLOGY EXAM  Never done   Diabetic kidney evaluation -  Urine ACR  Never done   Hepatitis B Vaccines 19-59 Average Risk (1 of 3 - 19+ 3-dose series) Never done    Results for orders placed or performed in visit on 07/14/24  POCT glycosylated hemoglobin (Hb A1C)  Result Value Ref Range   Hemoglobin A1C 6.4 (A) 4.0 - 5.6 %   HbA1c POC (<> result, manual entry)     HbA1c, POC (prediabetic range)     HbA1c, POC (controlled diabetic range)      The 10-year ASCVD risk score (Arnett DK, et al., 2019) is: 4.7%     Assessment & Plan:  Assessment and Plan    Type 2 diabetes mellitus A1c increased from 6.1% to 6.4%.  Discussed GLP-1 receptor agonist Mounjaro  for weight loss and diabetes management. Explained side effects and insurance coverage. - Continue metformin  as prescribed. - Obtain urine sample to check for proteinuria. - Review eye exam records. - Start Mounjaro  (tirzepatide )  2.5 mg once weekly for four weeks, then increase to 5 mg once weekly. - Plan follow-up in three weeks to assess response to Mounjaro .  Obesity Recent abdominal weight gain contributing to back pain. Discussed GLP-1 receptor agonists for weight loss and diabetes control. Emphasized dietary modifications. - Start Mounjaro  (tirzepatide ) as discussed under Type 2 diabetes mellitus. - Discussed dietary modifications and importance of maintaining a healthy diet. - Discussed potential benefits of weight loss on back pain and plantar fasciitis.  Hyperlipidemia Managed with rosuvastatin  10 mg daily. Recent labs show good control. Weight loss from Mounjaro  may improve cholesterol. - Continue rosuvastatin  10 mg daily. - Plan to recheck cholesterol levels next year.        Orders Placed This Encounter  Procedures   Microalbumin / creatinine urine ratio   POCT glycosylated hemoglobin (Hb A1C)   No images are attached to the encounter or orders placed in the encounter. Meds ordered this encounter  Medications   tirzepatide  (MOUNJARO ) 2.5 MG/0.5ML Pen    Sig: Inject  2.5mg  into skin once weekly for 4 weeks.    Dispense:  2 mL    Refill:  0    Supervising Provider:   SEBASTIAN BEVERLEY NOVAK [[id number]]    Return in about 3 weeks (around 08/04/2024) for Diabetes.   [name], FNP

## 2024-07-15 LAB — MICROALBUMIN / CREATININE URINE RATIO
Creatinine,U: 108 mg/dL
Microalb Creat Ratio: 8.6 mg/g (ref 0.0–30.0)
Microalb, Ur: 0.9 mg/dL (ref 0.0–1.9)

## 2024-07-16 ENCOUNTER — Ambulatory Visit: Payer: Self-pay | Admitting: Internal Medicine

## 2024-07-28 ENCOUNTER — Encounter: Payer: Self-pay | Admitting: Internal Medicine

## 2024-07-30 ENCOUNTER — Ambulatory Visit: Payer: Self-pay

## 2024-07-30 ENCOUNTER — Ambulatory Visit: Admitting: Nurse Practitioner

## 2024-07-30 ENCOUNTER — Encounter: Payer: Self-pay | Admitting: Nurse Practitioner

## 2024-07-30 VITALS — BP 136/80 | HR 76 | Temp 98.1°F | Resp 18 | Wt 207.2 lb

## 2024-07-30 DIAGNOSIS — U071 COVID-19: Secondary | ICD-10-CM | POA: Diagnosis not present

## 2024-07-30 DIAGNOSIS — H6690 Otitis media, unspecified, unspecified ear: Secondary | ICD-10-CM

## 2024-07-30 DIAGNOSIS — J069 Acute upper respiratory infection, unspecified: Secondary | ICD-10-CM

## 2024-07-30 LAB — POCT INFLUENZA A/B
Influenza A, POC: NEGATIVE
Influenza B, POC: NEGATIVE

## 2024-07-30 LAB — POC COVID19 BINAXNOW: SARS Coronavirus 2 Ag: POSITIVE — AB

## 2024-07-30 MED ORDER — PREDNISONE 20 MG PO TABS: 40.0000 mg | ORAL_TABLET | Freq: Every day | ORAL | 0 refills | Status: DC

## 2024-07-30 MED ORDER — AZITHROMYCIN 250 MG PO TABS: ORAL_TABLET | ORAL | 0 refills | Status: AC

## 2024-07-30 NOTE — Telephone Encounter (Signed)
  FYI Only or Action Required?: FYI only for provider.  Patient was last seen in primary care on 07/14/2024 by Billy Knee, FNP.  Called Nurse Triage reporting Sore Throat.  Symptoms began several days ago.  Interventions attempted: OTC medications: Allergy, Tylenol , Motrin , Numbing spray and Rest, hydration, or home remedies.  Symptoms are: gradually worsening.  Triage Disposition: See Physician Within 24 Hours  Patient/caregiver understands and will follow disposition?: Yes  Copied from CRM [id number]. Topic: Clinical - Red Word Triage >> Jul 30, 2024  1:59 PM Gennette ORN wrote: Patient is seeing blood in her mucus. She is congested as well and she barely can talk due to her sore throat. Reason for Disposition  SEVERE throat pain (e.g., excruciating)  Answer Assessment - Initial Assessment Questions Additional info:  1) Has tried otc allergy meds, Tylenol , Motrin , without effect,  sore throat spray burns intensely.  2) Requesting SDV or next week. Scheduled with alternate provider today.    1. ONSET: When did the throat start hurting? (Hours or days ago)      Sunday 2. SEVERITY: How bad is the sore throat? (Scale 1-10; mild, moderate or severe)     Severe  3. STREP EXPOSURE: Has there been any exposure to strep within the past week? If Yes, ask: What type of contact occurred?      unsure 4.  VIRAL SYMPTOMS: Are there any symptoms of a cold, such as a runny nose, cough, hoarse voice or red eyes?      Cough, sinus pressure and congestion 5. FEVER: Do you have a fever? If Yes, ask: What is your temperature, how was it measured, and when did it start?     Denies  6. PUS ON THE TONSILS: Is there pus on the tonsils in the back of your throat?     Has not checked  7. OTHER SYMPTOMS: Do you have any other symptoms? (e.g., difficulty breathing, headache, rash)   Blood tinged sputum, horse voice.  Protocols used: Sore Throat-A-AH

## 2024-07-30 NOTE — Progress Notes (Signed)
 Acute Office Visit  Subjective:     Patient ID: Anne Burton, female    DOB: Jul 10, 1976, 48 y.o.   MRN: [id number]  Chief Complaint  Patient presents with   URI    Pt c/o of cough, sore throat, congestion for 5 days symptoms began on Sunday. Pt used OTC allergy medication and mucinex    HPI Patient is in today for cough, sore throat, congestion for 5 days.  She has also noticed that she is losing her voice.  She denies fevers, ear pain, shortness of breath, chest pain.  She is having some slight sinus headaches.  She has tried Mucinex and over-the-counter allergy medication which has not helped.  She denies sick contacts.  ROS See pertinent positives and negatives per HPI.     Objective:    BP 136/80 (BP Location: Left Arm, Patient Position: Sitting, Cuff Size: Large)   Pulse 76   Temp 98.1 F (36.7 C) (Oral)   Resp 18   Wt 207 lb 3.2 oz (94 kg)   LMP 05/23/2014 (Approximate)   SpO2 97%   BMI 38.52 kg/m    Physical Exam Vitals and nursing note reviewed.  Constitutional:      General: She is not in acute distress.    Appearance: Normal appearance.  HENT:     Head: Normocephalic.     Right Ear: Tympanic membrane, ear canal and external ear normal.     Left Ear: Ear canal and external ear normal. Tympanic membrane is erythematous.     Nose:     Right Sinus: No maxillary sinus tenderness or frontal sinus tenderness.     Left Sinus: No maxillary sinus tenderness or frontal sinus tenderness.     Mouth/Throat:     Mouth: Mucous membranes are moist.     Pharynx: Posterior oropharyngeal erythema present. No oropharyngeal exudate.  Eyes:     Conjunctiva/sclera: Conjunctivae normal.  Cardiovascular:     Rate and Rhythm: Normal rate and regular rhythm.     Pulses: Normal pulses.     Heart sounds: Normal heart sounds.  Pulmonary:     Effort: Pulmonary effort is normal.     Breath sounds: Normal breath sounds.  Musculoskeletal:     Cervical back: Normal range of motion  and neck supple. No tenderness.  Lymphadenopathy:     Cervical: No cervical adenopathy.  Skin:    General: Skin is warm.  Neurological:     General: No focal deficit present.     Mental Status: She is alert and oriented to person, place, and time.  Psychiatric:        Mood and Affect: Mood normal.        Behavior: Behavior normal.        Thought Content: Thought content normal.        Judgment: Judgment normal.     Results for orders placed or performed in visit on 07/30/24  POC COVID-19 BinaxNow  Result Value Ref Range   SARS Coronavirus 2 Ag Positive (A) Negative  POCT Influenza A/B  Result Value Ref Range   Influenza A, POC Negative Negative   Influenza B, POC Negative Negative        Assessment & Plan:   Problem List Items Addressed This Visit   None Visit Diagnoses       COVID-19    -  Primary   Encourage rest, fluids. Discussed wearing mask and washing hands frequently. Follow-up with any concerns.   Relevant Medications  azithromycin  (ZITHROMAX ) 250 MG tablet     Upper respiratory tract infection, unspecified type       Flu negative, covid-19 positive. Encourage rest, fluids. Will start prednisone  40mg  daily to help with hoarse voice and sore throat.   Relevant Medications   azithromycin  (ZITHROMAX ) 250 MG tablet   Other Relevant Orders   POC COVID-19 BinaxNow (Completed)   POCT Influenza A/B (Completed)     Acute otitis media, unspecified otitis media type       Start zpak 2 tablets today, then 1 tablet daily until gone. Can take tylenol  or ibuprofen  if needed for ear pain.   Relevant Medications   azithromycin  (ZITHROMAX ) 250 MG tablet       Meds ordered this encounter  Medications   predniSONE  (DELTASONE ) 20 MG tablet    Sig: Take 2 tablets (40 mg total) by mouth daily with breakfast.    Dispense:  10 tablet    Refill:  0   azithromycin  (ZITHROMAX ) 250 MG tablet    Sig: Take 2 tablets on day 1, then 1 tablet daily on days 2 through 5     Dispense:  6 tablet    Refill:  0    Return if symptoms worsen or fail to improve.  Tinnie DELENA Harada, NP

## 2024-07-30 NOTE — Patient Instructions (Signed)
 It was great to see you!  Start prednisone  2 tablet daily with breakfast   Start zpak 2 tablets today, then 1 tablet daily   Drink plenty of fluid and get rest   Let's follow-up if symptoms worsen or don't improve   Take care,  Tinnie Harada, NP

## 2024-08-05 ENCOUNTER — Encounter: Payer: Self-pay | Admitting: Internal Medicine

## 2024-08-05 ENCOUNTER — Ambulatory Visit: Admitting: Internal Medicine

## 2024-08-05 VITALS — BP 128/72 | HR 77 | Temp 97.8°F | Ht 61.5 in | Wt 206.8 lb

## 2024-08-05 DIAGNOSIS — E119 Type 2 diabetes mellitus without complications: Secondary | ICD-10-CM

## 2024-08-05 MED ORDER — TIRZEPATIDE 5 MG/0.5ML ~~LOC~~ SOAJ: 5.0000 mg | SUBCUTANEOUS | 2 refills | Status: DC

## 2024-08-05 NOTE — Patient Instructions (Signed)
 Continue Mounjaro  2.5mg  once weekly for the next 2 weeks, then increase to the 5mg  dose once weekly.

## 2024-08-05 NOTE — Progress Notes (Signed)
 [facility] [address] [place] [address] Dept: [phone] Dept Fax: [phone]    Subjective:   Anne Burton [birth year] 08/05/2024  Chief Complaint  Patient presents with   Follow-up    Mounjaro  doing well with it, Cortizone shot in wrist    HPI: Anne Burton presents today for re-assessment and management of chronic medical conditions.  Patient presents for follow of up T2DM. Patient was started on Mounjaro  2.5mg  from last visit. She has taken 2 doses so far with no side effects. Denies abdominal pain, N/V/D, constipation. Has noticed some decrease in appetite.   Lab Results  Component Value Date   HGBA1C 6.4 (A) 07/14/2024    Wt Readings from Last 3 Encounters:  08/05/24 206 lb 12.8 oz (93.8 kg)  07/30/24 207 lb 3.2 oz (94 kg)  07/14/24 208 lb (94.3 kg)    The following portions of the patient's history were reviewed and updated as appropriate: past medical history, past surgical history, family history, social history, allergies, medications, and problem list.   Patient Active Problem List   Diagnosis Date Noted   Bilateral carpal tunnel syndrome 12/11/2023   Obesity (BMI 30-39.9) 11/11/2020   Hyperlipidemia associated with type 2 diabetes mellitus (HCC) 10/27/2020   Type 2 diabetes mellitus without complication, without long-term current use of insulin  (HCC) 10/27/2020   Plantar fasciitis 03/03/2018   S/P TAH (total abdominal hysterectomy) 02/10/2015   Uterine scar from previous cesarean delivery, antepartum 01/18/2015   Not immune to rubella 12/02/2014   Past Medical History:  Diagnosis Date   Allergy    Anemia    Hyperlipidemia associated with type 2 diabetes mellitus (HCC) 10/27/2020   Pelvic pain    hydrosalpynx   Past Surgical History:  Procedure Laterality Date   ABDOMINAL HYSTERECTOMY     done with 2016 C/S Surgery-general anesthesia   CESAREAN SECTION  1994,1997,2004,2016   x 4    LAPAROSCOPIC BILATERAL SALPINGECTOMY N/A 03/11/2018   Procedure: OPERATIVE LAPAROSCOPY WITH EXTENSIVE LYSIS OF ADHESIONS;  Surgeon: Darcel Pool, MD;  Location: [facility];  Service: Gynecology;  Laterality: N/A;   LAPAROSCOPIC LYSIS OF ADHESIONS N/A 03/11/2018   Procedure: LAPAROSCOPIC LYSIS OF ADHESIONS;  Surgeon: Darcel Pool, MD;  Location: [facility];  Service: Gynecology;  Laterality: N/A;   Family History  Problem Relation Age of Onset   Hypertension Mother    Diabetes Mother    Heart attack Mother 70   Heart attack Father 76   Breast cancer Maternal Grandmother    Stomach cancer Maternal Grandfather    Colon cancer Neg Hx    Colon polyps Neg Hx    Esophageal cancer Neg Hx    Rectal cancer Neg Hx     Current Outpatient Medications:    acetaminophen  (TYLENOL ) 325 MG tablet, Take 650 mg by mouth every 6 (six) hours as needed., Disp: , Rfl:    glucose blood (CONTOUR NEXT TEST) test strip, Test once a day. Pt uses contour next one meter, Disp: 100 each, Rfl: 1   metFORMIN  (GLUCOPHAGE -XR) 500 MG 24 hr tablet, TAKE 1 TABLET(500 MG) BY MOUTH DAILY WITH BREAKFAST, Disp: 90 tablet, Rfl: 1   Microlet Lancets MISC, Test once a day. Pt uses contour next one meter, Disp: 100 each, Rfl: 1   rosuvastatin  (CRESTOR ) 10 MG tablet, TAKE 1 TABLET(10 MG) BY MOUTH DAILY, Disp: 90 tablet, Rfl: 1   tirzepatide  (MOUNJARO ) 5 MG/0.5ML Pen, Inject 5 mg into the skin once a week., Disp:  2 mL, Rfl: 2  Current Facility-Administered Medications:    0.9 %  sodium chloride  infusion, 500 mL, Intravenous, Once, Teressa Toribio SQUIBB, MD No Known Allergies   ROS: A complete ROS was performed with pertinent positives/negatives noted in the HPI. The remainder of the ROS are negative.    Objective:   Today's Vitals   08/05/24 1510  BP: 128/72  Pulse: 77  Temp: 97.8 F (36.6 C)  TempSrc: Temporal  SpO2: 97%  Weight: 206 lb 12.8 oz (93.8 kg)  Height: 5' 1.5 (1.562 m)    GENERAL: Well-appearing, in NAD. Well  nourished.  SKIN: Pink, warm and dry. RESPIRATORY: Chest wall symmetrical. Respirations even and non-labored. Breath sounds clear to auscultation bilaterally.  CARDIAC: S1, S2 present, regular rate and rhythm. Peripheral pulses 2+ bilaterally.  EXTREMITIES: Without clubbing, cyanosis, or edema.  PSYCH/MENTAL STATUS: Alert, oriented x 3. Cooperative, appropriate mood and affect.     Assessment & Plan:  1. Type 2 diabetes mellitus without complication, without long-term current use of insulin  (HCC) (Primary) - continue Mounjaro  2.5 mg once weekly for 2 more doses to complete 4-week series, then increase to 5 mg once weekly - tirzepatide  (MOUNJARO ) 5 MG/0.5ML Pen; Inject 5 mg into the skin once a week.  Dispense: 2 mL; Refill: 2    No orders of the defined types were placed in this encounter.  No images are attached to the encounter or orders placed in the encounter. Meds ordered this encounter  Medications   tirzepatide  (MOUNJARO ) 5 MG/0.5ML Pen    Sig: Inject 5 mg into the skin once a week.    Dispense:  2 mL    Refill:  2    Supervising Provider:   SEBASTIAN BEVERLEY NOVAK [[id number]]    Return in about 3 months (around 11/04/2024) for Chronic Condition follow up.   [name], FNP

## 2024-09-01 ENCOUNTER — Ambulatory Visit (INDEPENDENT_AMBULATORY_CARE_PROVIDER_SITE_OTHER): Admitting: Orthopaedic Surgery

## 2024-09-01 DIAGNOSIS — G5602 Carpal tunnel syndrome, left upper limb: Secondary | ICD-10-CM

## 2024-09-01 DIAGNOSIS — G5601 Carpal tunnel syndrome, right upper limb: Secondary | ICD-10-CM

## 2024-09-01 MED ORDER — BUPIVACAINE HCL 0.5 % IJ SOLN
1.0000 mL | INTRAMUSCULAR | Status: AC | PRN
  Administered 2024-09-01: 1 mL

## 2024-09-01 MED ORDER — LIDOCAINE HCL 1 % IJ SOLN
1.0000 mL | INTRAMUSCULAR | Status: AC | PRN
  Administered 2024-09-01: 1 mL

## 2024-09-01 MED ORDER — METHYLPREDNISOLONE ACETATE 40 MG/ML IJ SUSP
40.0000 mg | INTRAMUSCULAR | Status: AC | PRN
  Administered 2024-09-01: 40 mg

## 2024-09-01 NOTE — Progress Notes (Signed)
 Office Visit Note   Patient: Anne Burton           Date of Birth: December 26, 1975           MRN: [id number] Visit Date: 09/01/2024              Requested by: Billy Knee, FNP [address] [place],  [place] [address] PCP: Billy Knee, FNP   Assessment & Plan: Visit Diagnoses:  1. Right carpal tunnel syndrome   2. Left carpal tunnel syndrome     Plan: History of Present Illness Anne Burton is a 48 year old female with bilateral carpal tunnel syndrome who presents for follow-up of symptoms in both hands.  She has experienced a recurrence of symptoms, especially in her left hand, which was last treated with injections in January. The injections initially provided relief, but the effects have diminished over time. Previous nerve studies confirmed carpal tunnel syndrome.  Exams of bilateral hands are unchanged from prior visit.  Assessment and Plan Bilateral carpal tunnel syndrome Chronic condition with symptom recurrence. Previous injections provided temporary relief. Reassessment needed to determine treatment plan. Surgery considered if nerve studies show severe or moderately severe syndrome. - Order updated nerve conduction studies to assess severity of disease given progression of symptoms. - Consider surgery if nerve studies indicate severe or moderately severe syndrome. - Administer left carpal tunnel injection  Follow-Up Instructions: No follow-ups on file.   Orders:  Orders Placed This Encounter  Procedures   Hand/UE Inj   Ambulatory referral to Physical Medicine Rehab   No orders of the defined types were placed in this encounter.     Procedures: Hand/UE Inj: L carpal tunnel for carpal tunnel syndrome on 09/01/2024 12:42 PM Details: 25 G needle Medications: 1 mL lidocaine  1 %; 1 mL bupivacaine  0.5 %; 40 mg methylPREDNISolone  acetate 40 MG/ML Outcome: tolerated well, no immediate complications Patient was prepped and draped in the usual sterile fashion.        Clinical Data: No additional findings.   Subjective: Chief Complaint  Patient presents with   Left Hand - Pain   Right Hand - Pain    HPI  Review of Systems  Constitutional: Negative.   HENT: Negative.    Eyes: Negative.   Respiratory: Negative.    Cardiovascular: Negative.   Endocrine: Negative.   Musculoskeletal: Negative.   Neurological: Negative.   Hematological: Negative.   Psychiatric/Behavioral: Negative.    All other systems reviewed and are negative.    Objective: Vital Signs: LMP 05/23/2014 (Approximate)   Physical Exam Vitals and nursing note reviewed.  Constitutional:      Appearance: She is well-developed.  HENT:     Head: Atraumatic.     Nose: Nose normal.  Eyes:     Extraocular Movements: Extraocular movements intact.  Cardiovascular:     Pulses: Normal pulses.  Pulmonary:     Effort: Pulmonary effort is normal.  Abdominal:     Palpations: Abdomen is soft.  Musculoskeletal:     Cervical back: Neck supple.  Skin:    General: Skin is warm.     Capillary Refill: Capillary refill takes less than 2 seconds.  Neurological:     Mental Status: She is alert. Mental status is at baseline.  Psychiatric:        Behavior: Behavior normal.        Thought Content: Thought content normal.        Judgment: Judgment normal.     Ortho Exam  Specialty Comments:  No specialty comments available.  Imaging: No results found.   PMFS History: Patient Active Problem List   Diagnosis Date Noted   Bilateral carpal tunnel syndrome 12/11/2023   Obesity (BMI 30-39.9) 11/11/2020   Hyperlipidemia associated with type 2 diabetes mellitus (HCC) 10/27/2020   Type 2 diabetes mellitus without complication, without long-term current use of insulin  (HCC) 10/27/2020   Plantar fasciitis 03/03/2018   S/P TAH (total abdominal hysterectomy) 02/10/2015   Uterine scar from previous cesarean delivery, antepartum 01/18/2015   Not immune to rubella 12/02/2014    Past Medical History:  Diagnosis Date   Allergy    Anemia    Hyperlipidemia associated with type 2 diabetes mellitus (HCC) 10/27/2020   Pelvic pain    hydrosalpynx    Family History  Problem Relation Age of Onset   Hypertension Mother    Diabetes Mother    Heart attack Mother 64   Heart attack Father 7   Breast cancer Maternal Grandmother    Stomach cancer Maternal Grandfather    Colon cancer Neg Hx    Colon polyps Neg Hx    Esophageal cancer Neg Hx    Rectal cancer Neg Hx     Past Surgical History:  Procedure Laterality Date   ABDOMINAL HYSTERECTOMY     done with 2016 C/S Surgery-general anesthesia   CESAREAN SECTION  [phone]   x 4   LAPAROSCOPIC BILATERAL SALPINGECTOMY N/A 03/11/2018   Procedure: OPERATIVE LAPAROSCOPY WITH EXTENSIVE LYSIS OF ADHESIONS;  Surgeon: Darcel Pool, MD;  Location: [facility];  Service: Gynecology;  Laterality: N/A;   LAPAROSCOPIC LYSIS OF ADHESIONS N/A 03/11/2018   Procedure: LAPAROSCOPIC LYSIS OF ADHESIONS;  Surgeon: Darcel Pool, MD;  Location: [facility];  Service: Gynecology;  Laterality: N/A;   Social History   Occupational History   Not on file  Tobacco Use   Smoking status: Never   Smokeless tobacco: Never  Vaping Use   Vaping status: Never Used  Substance and Sexual Activity   Alcohol use: No   Drug use: No   Sexual activity: Yes    Birth control/protection: None

## 2024-09-09 ENCOUNTER — Other Ambulatory Visit: Payer: Self-pay | Admitting: Internal Medicine

## 2024-09-09 DIAGNOSIS — E119 Type 2 diabetes mellitus without complications: Secondary | ICD-10-CM

## 2024-09-09 DIAGNOSIS — M25551 Pain in right hip: Secondary | ICD-10-CM

## 2024-09-09 DIAGNOSIS — G5603 Carpal tunnel syndrome, bilateral upper limbs: Secondary | ICD-10-CM

## 2024-09-11 NOTE — Telephone Encounter (Signed)
 Pt is wanting refill on tirzepatide  (MOUNJARO ) 5 MG/0.5ML Pen   LOV 08/05/24 FOV not schduled LRF 08/05/24  meloxicam  (MOBIC ) 7.5 MG tablet   Expired script on 04/07/24  LRF 03/24/24

## 2024-09-16 ENCOUNTER — Other Ambulatory Visit: Payer: Self-pay

## 2024-09-16 DIAGNOSIS — E119 Type 2 diabetes mellitus without complications: Secondary | ICD-10-CM

## 2024-09-16 DIAGNOSIS — G5603 Carpal tunnel syndrome, bilateral upper limbs: Secondary | ICD-10-CM

## 2024-09-16 NOTE — Telephone Encounter (Signed)
 Pt requesting refill for meloxicam  (MOBIC ) 15 MG tablet    tirzepatide  (MOUNJARO ) 5 MG/0.5ML Pen   LOV  07/27/24 LRF meloxicam  08/27/24 Mounjaro  08/05/24 FOV  10/26/24

## 2024-09-17 MED ORDER — TIRZEPATIDE 5 MG/0.5ML ~~LOC~~ SOAJ: 5.0000 mg | SUBCUTANEOUS | 2 refills | Status: DC

## 2024-09-17 MED ORDER — MELOXICAM 15 MG PO TABS: 15.0000 mg | ORAL_TABLET | Freq: Every day | ORAL | 0 refills | Status: AC | PRN

## 2024-09-22 ENCOUNTER — Ambulatory Visit (INDEPENDENT_AMBULATORY_CARE_PROVIDER_SITE_OTHER): Admitting: Physical Medicine and Rehabilitation

## 2024-09-22 DIAGNOSIS — R202 Paresthesia of skin: Secondary | ICD-10-CM

## 2024-09-22 DIAGNOSIS — M79642 Pain in left hand: Secondary | ICD-10-CM

## 2024-09-22 DIAGNOSIS — M79641 Pain in right hand: Secondary | ICD-10-CM

## 2024-09-22 DIAGNOSIS — R29898 Other symptoms and signs involving the musculoskeletal system: Secondary | ICD-10-CM | POA: Diagnosis not present

## 2024-09-22 NOTE — Progress Notes (Unsigned)
 Pain Scale   Average Pain 3 Patient advising she has bilateral pain, numbness and tingling with some weakness in both hands. Patient is Right Hand Dominate.        +Driver, -BT, -Dye Allergies.

## 2024-09-22 NOTE — Progress Notes (Unsigned)
 Anne Burton - 48 y.o. female MRN [id number]  Date of birth: [birth year]  Office Visit Note: Visit Date: 09/22/2024 PCP: Billy Knee, FNP Referred by: Jerri Kay HERO, MD  Subjective: Chief Complaint  Patient presents with   Right Hand - Pain, Numbness, Weakness   Left Hand - Pain, Numbness, Weakness   HPI: Anne Burton is a 48 y.o. female who comes in today at the request of Dr. Ozell Jerri for evaluation and management of chronic, worsening and severe pain, numbness and tingling in the Bilateral upper extremities.  Patient is Right hand dominant.  a recurrence of symptoms, especially in her left hand, which was last treated with injections in January. The injections initially provided relief, but the effects have diminished over time. Previous nerve studies confirmed carpal tunnel syndrome.   Exams of bilateral hands are unchanged from prior visit.         Review of Systems  Musculoskeletal:  Positive for joint pain.  Neurological:  Positive for tingling and focal weakness.  All other systems reviewed and are negative.  Otherwise per HPI.  Assessment & Plan: Visit Diagnoses:    ICD-10-CM   1. Paresthesia of skin  R20.2 NCV with EMG (electromyography)       Plan: No additional findings.   Meds & Orders: No orders of the defined types were placed in this encounter.   Orders Placed This Encounter  Procedures   NCV with EMG (electromyography)    Follow-up: No follow-ups on file.   Procedures: No procedures performed      Clinical History: No specialty comments available.   She reports that she has never smoked. She has never used smokeless tobacco.  Recent Labs    12/16/23 1000 03/10/24 1545 07/14/24 1654  HGBA1C 6.8* 6.1* 6.4*    Objective:  VS:  HT:    WT:   BMI:     BP:   HR: bpm  TEMP: ( )  RESP:  Physical Exam Vitals and nursing note reviewed.  Constitutional:      General: She is not in acute distress.    Appearance: Normal appearance.  She is well-developed. She is obese.  HENT:     Head: Normocephalic and atraumatic.     Nose: Nose normal.     Mouth/Throat:     Mouth: Mucous membranes are moist.     Pharynx: Oropharynx is clear.  Eyes:     Conjunctiva/sclera: Conjunctivae normal.     Pupils: Pupils are equal, round, and reactive to light.  Cardiovascular:     Rate and Rhythm: Regular rhythm.  Pulmonary:     Effort: Pulmonary effort is normal. No respiratory distress.  Abdominal:     General: There is no distension.     Palpations: Abdomen is soft.     Tenderness: There is no guarding.  Musculoskeletal:        General: No swelling, tenderness or deformity.     Cervical back: Normal range of motion and neck supple.     Right lower leg: No edema.     Left lower leg: No edema.     Comments: Inspection reveals no atrophy of the bilateral APB or FDI or hand intrinsics. There is no swelling, color changes, allodynia or dystrophic changes. There is 5 out of 5 strength in the bilateral wrist extension, finger abduction and long finger flexion. There is intact sensation to light touch in all dermatomal and peripheral nerve distributions. There is a negative Froment's test bilaterally. There is  a negative Tinel's test at the bilateral wrist and elbow. There is a negative Phalen's test bilaterally. There is a negative Hoffmann's test bilaterally.  Skin:    General: Skin is warm and dry.     Findings: No erythema or rash.  Neurological:     General: No focal deficit present.     Mental Status: She is alert and oriented to person, place, and time.     Motor: No weakness or abnormal muscle tone.     Coordination: Coordination normal.     Gait: Gait normal.  Psychiatric:        Mood and Affect: Mood normal.        Behavior: Behavior normal.        Thought Content: Thought content normal.     Ortho Exam  Imaging: No results found.  Past Medical/Family/Surgical/Social History: Medications & Allergies reviewed per EMR,  new medications updated. Patient Active Problem List   Diagnosis Date Noted   Bilateral carpal tunnel syndrome 12/11/2023   Obesity (BMI 30-39.9) 11/11/2020   Hyperlipidemia associated with type 2 diabetes mellitus (HCC) 10/27/2020   Type 2 diabetes mellitus without complication, without long-term current use of insulin  (HCC) 10/27/2020   Plantar fasciitis 03/03/2018   S/P TAH (total abdominal hysterectomy) 02/10/2015   Uterine scar from previous cesarean delivery, antepartum 01/18/2015   Not immune to rubella 12/02/2014   Past Medical History:  Diagnosis Date   Allergy    Anemia    Hyperlipidemia associated with type 2 diabetes mellitus (HCC) 10/27/2020   Pelvic pain    hydrosalpynx   Family History  Problem Relation Age of Onset   Hypertension Mother    Diabetes Mother    Heart attack Mother 72   Heart attack Father 21   Breast cancer Maternal Grandmother    Stomach cancer Maternal Grandfather    Colon cancer Neg Hx    Colon polyps Neg Hx    Esophageal cancer Neg Hx    Rectal cancer Neg Hx    Past Surgical History:  Procedure Laterality Date   ABDOMINAL HYSTERECTOMY     done with 2016 C/S Surgery-general anesthesia   CESAREAN SECTION  [phone]   x 4   LAPAROSCOPIC BILATERAL SALPINGECTOMY N/A 03/11/2018   Procedure: OPERATIVE LAPAROSCOPY WITH EXTENSIVE LYSIS OF ADHESIONS;  Surgeon: Darcel Pool, MD;  Location: [facility];  Service: Gynecology;  Laterality: N/A;   LAPAROSCOPIC LYSIS OF ADHESIONS N/A 03/11/2018   Procedure: LAPAROSCOPIC LYSIS OF ADHESIONS;  Surgeon: Darcel Pool, MD;  Location: [facility];  Service: Gynecology;  Laterality: N/A;   Social History   Occupational History   Not on file  Tobacco Use   Smoking status: Never   Smokeless tobacco: Never  Vaping Use   Vaping status: Never Used  Substance and Sexual Activity   Alcohol use: No   Drug use: No   Sexual activity: Yes    Birth control/protection: None

## 2024-09-23 ENCOUNTER — Encounter: Payer: Self-pay | Admitting: Physical Medicine and Rehabilitation

## 2024-09-23 NOTE — Procedures (Signed)
 EMG & NCV Findings: Evaluation of the left median motor nerve showed prolonged distal onset latency (6.1 ms), reduced amplitude (2.0 mV), and decreased conduction velocity (Elbow-Wrist, 45 m/s).  The right median motor nerve showed prolonged distal onset latency (4.7 ms) and decreased conduction velocity (Elbow-Wrist, 44 m/s).  The left median (across palm) sensory nerve showed no response (Wrist) and prolonged distal peak latency (Palm, 4.1 ms).  The right median (across palm) sensory nerve showed prolonged distal peak latency (Wrist, 6.3 ms), reduced amplitude (9.8 V), and prolonged distal peak latency (Palm, 4.7 ms).  All remaining nerves (as indicated in the following tables) were within normal limits.  Left vs. Right side comparison data for the median motor nerve indicates abnormal L-R latency difference (1.4 ms) and abnormal L-R amplitude difference (61.5 %).  All remaining left vs. right side differences were within normal limits.    Needle evaluation of the left abductor pollicis brevis muscle showed increased insertional activity and slightly increased spontaneous activity.  All remaining muscles (as indicated in the following table) showed no evidence of electrical instability.    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   a moderate to severe right median nerve entrapment at the wrist (carpal tunnel syndrome) affecting sensory and motor components.   There is no significant electrodiagnostic evidence of any other focal nerve entrapment, brachial plexopathy or cervical radiculopathy.   Recommendations: 1.  Continue current management of symptoms. 2.  Follow-up with referring physician. 3.  Suggest surgical evaluation.  ___________________________ Prentice Masters FAAPMR Board Certified, American Board of Physical Medicine and  Rehabilitation    Nerve Conduction Studies Anti Sensory Summary Table   Stim Site NR Peak (ms) Norm Peak (ms) P-T Amp (V) Norm P-T Amp Site1 Site2 Delta-P (ms) Dist (cm) Vel (m/s) Norm Vel (m/s)  Left Median Acr Palm Anti Sensory (2nd Digit)  31.4C  Wrist *NR  <3.6  >10 Wrist Palm  0.0    Palm    *4.1 <2.0 13.3         Right Median Acr Palm Anti Sensory (2nd Digit)  32C  Wrist    *6.3 <3.6 *9.8 >10 Wrist Palm 1.6 0.0    Palm    *4.7 <2.0 4.0         Left Radial Anti Sensory (Base 1st Digit)  31.4C  Wrist    1.9 <3.1 55.7  Wrist Base 1st Digit 1.9 0.0    Right Radial Anti Sensory (Base 1st Digit)  32.3C  Wrist    2.0 <3.1 49.0  Wrist Base 1st Digit 2.0 0.0    Left Ulnar Anti Sensory (5th Digit)  31.8C  Wrist    3.0 <3.7 24.4 >15.0 Wrist 5th Digit 3.0 14.0 47 >38  Right Ulnar Anti Sensory (5th Digit)  32.3C  Wrist    2.8 <3.7 34.1 >15.0 Wrist 5th Digit 2.8 14.0 50 >38   Motor Summary Table   Stim Site NR Onset (ms) Norm Onset (ms) O-P Amp (mV) Norm O-P Amp Site1 Site2 Delta-0 (ms) Dist (cm) Vel (m/s) Norm Vel (m/s)  Left Median Motor (Abd Poll Brev)  31.8C  Wrist    *6.1 <4.2 *2.0 >5 Elbow Wrist 4.2 19.0 *45 >50  Elbow    10.3  1.6         Right Median Motor (Abd Poll Brev)  32.2C  Wrist    *  4.7 <4.2 5.2 >5 Elbow Wrist 4.3 19.0 *44 >50  Elbow    9.0  4.5         Left Ulnar Motor (Abd Dig Min)  32.1C  Wrist    2.4 <4.2 10.7 >3 B Elbow Wrist 2.9 17.5 60 >53  B Elbow    5.3  11.0  A Elbow B Elbow 1.2 10.0 83 >53  A Elbow    6.5  10.6         Right Ulnar Motor (Abd Dig Min)  32.2C  Wrist    2.4 <4.2 10.5 >3 B Elbow Wrist 2.9 19.0 66 >53  B Elbow    5.3  10.5  A Elbow B Elbow 1.0 10.0 100 >53  A Elbow    6.3  10.3          EMG   Side Muscle Nerve Root Ins Act Fibs Psw Amp Dur Poly Recrt Int Bruna Comment  Left Abd Poll Brev Median C8-T1 *Incr *1+ *1+ Nml Nml 0 Nml Nml   Left 1stDorInt Ulnar C8-T1 Nml Nml Nml Nml Nml 0 Nml Nml   Left PronatorTeres Median C6-7 Nml Nml  Nml Nml Nml 0 Nml Nml   Left Biceps Musculocut C5-6 Nml Nml Nml Nml Nml 0 Nml Nml   Left Deltoid Axillary C5-6 Nml Nml Nml Nml Nml 0 Nml Nml     Nerve Conduction Studies Anti Sensory Left/Right Comparison   Stim Site L Lat (ms) R Lat (ms) L-R Lat (ms) L Amp (V) R Amp (V) L-R Amp (%) Site1 Site2 L Vel (m/s) R Vel (m/s) L-R Vel (m/s)  Median Acr Palm Anti Sensory (2nd Digit)  31.4C  Wrist  *6.3   *9.8  Wrist Palm     Palm *4.1 *4.7 0.6 13.3 4.0 69.9       Radial Anti Sensory (Base 1st Digit)  31.4C  Wrist 1.9 2.0 0.1 55.7 49.0 12.0 Wrist Base 1st Digit     Ulnar Anti Sensory (5th Digit)  31.8C  Wrist 3.0 2.8 0.2 24.4 34.1 28.4 Wrist 5th Digit 47 50 3   Motor Left/Right Comparison   Stim Site L Lat (ms) R Lat (ms) L-R Lat (ms) L Amp (mV) R Amp (mV) L-R Amp (%) Site1 Site2 L Vel (m/s) R Vel (m/s) L-R Vel (m/s)  Median Motor (Abd Poll Brev)  31.8C  Wrist *6.1 *4.7 *1.4 *2.0 5.2 *61.5 Elbow Wrist *45 *44 1  Elbow 10.3 9.0 1.3 1.6 4.5 64.4       Ulnar Motor (Abd Dig Min)  32.1C  Wrist 2.4 2.4 0.0 10.7 10.5 1.9 B Elbow Wrist 60 66 6  B Elbow 5.3 5.3 0.0 11.0 10.5 4.5 A Elbow B Elbow 83 100 17  A Elbow 6.5 6.3 0.2 10.6 10.3 2.8          Waveforms:

## 2024-09-28 ENCOUNTER — Encounter: Payer: Self-pay | Admitting: Radiology

## 2024-10-01 NOTE — Progress Notes (Unsigned)
 [facility] [address] [place] [address] Dept: [phone] Dept Fax: [phone]  Virtual Video Visit  I connected with Kaleeya Eskenazi on 10/02/24 at  9:40 AM EST by a video enabled telemedicine application and verified that I am speaking with the correct person using two identifiers.   Location patient: Home Location provider: Clinic Total time: 8 minutes Persons participating in the virtual visit: Patient; Avelina Finder CMA; [name], FNP-C  I discussed the limitations of evaluation and management by telemedicine and the availability of in-person appointments. The patient expressed understanding and agreed to proceed.  Chief Complaint  Patient presents with   Wrist Pain    Next step after seeing specialist    SUBJECTIVE:  HPI:   History of Present Illness   Anne Burton is a 48 year old female with bilateral carpal tunnel syndrome who presents to discuss her recent nerve conduction study results.  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   She has previously received steroid injections, which provided relief for about three months. She continues to use wrist braces and takes meloxicam  occasionally at night when experiencing pain or discomfort.  She is right-handed and has informed her job about the possibility of being out for surgery. She has not yet received follow-up communication from her orthopedic specialist regarding the next steps after the nerve conduction study results.      The following portions of the patient's history were reviewed and updated as appropriate: medical history, surgical history, medications, allergies, social history, and family history.    Past Medical History:  Diagnosis Date   Allergy    Anemia    Hyperlipidemia associated with type 2  diabetes mellitus (HCC) 10/27/2020   Pelvic pain    hydrosalpynx   Past Surgical History:  Procedure Laterality Date   ABDOMINAL HYSTERECTOMY     done with 2016 C/S Surgery-general anesthesia   CESAREAN SECTION  1994,1997,2004,2016   x 4   LAPAROSCOPIC BILATERAL SALPINGECTOMY N/A 03/11/2018   Procedure: OPERATIVE LAPAROSCOPY WITH EXTENSIVE LYSIS OF ADHESIONS;  Surgeon: Darcel Pool, MD;  Location: [facility];  Service: Gynecology;  Laterality: N/A;   LAPAROSCOPIC LYSIS OF ADHESIONS N/A 03/11/2018   Procedure: LAPAROSCOPIC LYSIS OF ADHESIONS;  Surgeon: Darcel Pool, MD;  Location: [facility];  Service: Gynecology;  Laterality: N/A;     Current Outpatient Medications:    acetaminophen  (TYLENOL ) 325 MG tablet, Take 650 mg by mouth every 6 (six) hours as needed., Disp: , Rfl:    glucose blood (CONTOUR NEXT TEST) test strip, Test once a day. Pt uses contour next one meter, Disp: 100 each, Rfl: 1   meloxicam  (MOBIC ) 15 MG tablet, Take 1 tablet (15 mg total) by mouth daily as needed for pain., Disp: 30 tablet, Rfl: 0   metFORMIN  (GLUCOPHAGE -XR) 500 MG 24 hr tablet, TAKE 1 TABLET(500 MG) BY MOUTH DAILY WITH BREAKFAST, Disp: 90 tablet, Rfl: 1   Microlet Lancets MISC, Test once a day. Pt uses contour next one meter, Disp: 100 each, Rfl: 1   rosuvastatin  (CRESTOR ) 10 MG tablet, TAKE 1 TABLET(10 MG) BY MOUTH DAILY, Disp: 90 tablet, Rfl: 1   tirzepatide  (MOUNJARO ) 5 MG/0.5ML Pen, Inject 5 mg into the skin once a week., Disp: 2 mL, Rfl: 2  Current Facility-Administered Medications:    0.9 %  sodium chloride  infusion, 500 mL, Intravenous, Once,  Teressa Toribio SQUIBB, MD No Known Allergies  Social History   Socioeconomic History   Marital status: Married    Spouse name: Not on file   Number of children: Not on file   Years of education: Not on file   Highest education level: Not on file  Occupational History   Not on file  Tobacco Use   Smoking status: Never   Smokeless tobacco: Never  Vaping Use    Vaping status: Never Used  Substance and Sexual Activity   Alcohol use: No   Drug use: No   Sexual activity: Yes    Birth control/protection: None  Other Topics Concern   Not on file  Social History Narrative   Not on file   Social Drivers of Health   Financial Resource Strain: Not on file  Food Insecurity: Not on file  Transportation Needs: Not on file  Physical Activity: Not on file  Stress: Not on file  Social Connections: Not on file  Intimate Partner Violence: Not on file    Family History  Problem Relation Age of Onset   Hypertension Mother    Diabetes Mother    Heart attack Mother 49   Heart attack Father 74   Breast cancer Maternal Grandmother    Stomach cancer Maternal Grandfather    Colon cancer Neg Hx    Colon polyps Neg Hx    Esophageal cancer Neg Hx    Rectal cancer Neg Hx      ROS: A complete ROS was performed with pertinent positives/negatives noted in the HPI. The remainder of the ROS are negative.    OBJECTIVE:  VITALS per patient if applicable: Today's Vitals   10/02/24 0941  Weight: 202 lb (91.6 kg)  Height: 5' 1 (1.549 m)   Body mass index is 38.17 kg/m.   GENERAL: Alert and oriented. Appears well and in no acute distress. HEENT: Atraumatic. Conjunctiva clear. No obvious abnormalities on inspection of external nose and ears. NECK: Normal movements of the head and neck. LUNGS: On inspection, no signs of respiratory distress. Breathing rate appears normal. No obvious gross SOB, gasping or wheezing, and no conversational dyspnea. CV: No obvious cyanosis. MS: Moves all visible extremities without noticeable abnormality. PSYCH/NEURO: Pleasant and cooperative. No obvious depression or anxiety. Speech and thought processing grossly intact.  ASSESSMENT AND PLAN: Assessment and Plan    Bilateral carpal tunnel syndrome (severe left, moderate-severe right) Severe left and moderate-severe right median nerve entrapment confirmed by nerve  conduction study. Steroid injections provided temporary relief. Surgical intervention likely necessary for more permanent correction. Left wrist more severe, may be prioritized for surgery.  - Contact orthopedist to discuss surgical plan and timeline. - Continue wrist braces and meloxicam  as needed for symptom relief.        I discussed the assessment and treatment plan with the patient. The patient was provided an opportunity to ask questions and all were answered. The patient agreed with the plan and demonstrated an understanding of the instructions.   The patient was advised to call back or seek an in-person evaluation if the symptoms worsen or if the condition fails to improve as anticipated.  Return for Scheduled Routine Office Visits and as needed.  [name], FNP

## 2024-10-02 ENCOUNTER — Telehealth (INDEPENDENT_AMBULATORY_CARE_PROVIDER_SITE_OTHER): Admitting: Internal Medicine

## 2024-10-02 ENCOUNTER — Encounter: Payer: Self-pay | Admitting: Internal Medicine

## 2024-10-02 VITALS — Ht 61.0 in | Wt 202.0 lb

## 2024-10-02 DIAGNOSIS — G5603 Carpal tunnel syndrome, bilateral upper limbs: Secondary | ICD-10-CM

## 2024-10-05 ENCOUNTER — Encounter: Payer: Self-pay | Admitting: Orthopaedic Surgery

## 2024-10-19 ENCOUNTER — Ambulatory Visit: Admitting: Family Medicine

## 2024-10-19 ENCOUNTER — Encounter: Payer: Self-pay | Admitting: Family Medicine

## 2024-10-19 VITALS — BP 102/70 | HR 72 | Temp 98.6°F | Resp 18 | Ht 61.0 in | Wt 197.4 lb

## 2024-10-19 DIAGNOSIS — J0140 Acute pansinusitis, unspecified: Secondary | ICD-10-CM

## 2024-10-19 MED ORDER — AMOXICILLIN-POT CLAVULANATE 875-125 MG PO TABS: 1.0000 | ORAL_TABLET | Freq: Two times a day (BID) | ORAL | 0 refills | Status: DC

## 2024-10-19 MED ORDER — PROMETHAZINE-DM 6.25-15 MG/5ML PO SYRP: 5.0000 mL | ORAL_SOLUTION | Freq: Four times a day (QID) | ORAL | 0 refills | Status: DC | PRN

## 2024-10-19 MED ORDER — FLUTICASONE PROPIONATE 50 MCG/ACT NA SUSP: 2.0000 | Freq: Every day | NASAL | 6 refills | Status: AC

## 2024-10-19 NOTE — Progress Notes (Signed)
 Subjective:    Patient ID: Anne Burton, female    DOB: [birth year], 48 y.o.   MRN: [id number]  Chief Complaint  Patient presents with   Nasal Congestion    Sxs almost 2 weeks, pt states having congestion, pro cough, lost voice, no fever. Pt states taking mucinex dm twice daily.    HPI Patient is in today for congestion x 2 weeks.  Discussed the use of AI scribe software for clinical note transcription with the patient, who gave verbal consent to proceed.  History of Present Illness Anne Burton is a 48 year old female with diabetes who presents with congestion and a raspy cough.  She has been experiencing congestion, thick mucus in her throat and nose, and a raspy cough for about a week and a half. She has been using Mucinex DMX, both day and night capsules, to manage her symptoms.  No fever has been noted, but she had a sore throat at the onset of her symptoms, which has since resolved. She continues to cough up gritty mucus, and the cough is affecting her sleep, keeping her awake at night.  Initially, she experienced headaches, which have subsided since the past weekend. She reports nasal congestion but denies using any nasal sprays like Flonase .  No allergies to medications. She denies a history of frequent yeast infections.    Past Medical History:  Diagnosis Date   Allergy    Anemia    Hyperlipidemia associated with type 2 diabetes mellitus (HCC) 10/27/2020   Pelvic pain    hydrosalpynx    Past Surgical History:  Procedure Laterality Date   ABDOMINAL HYSTERECTOMY     done with 2016 C/S Surgery-general anesthesia   CESAREAN SECTION  1994,1997,2004,2016   x 4   LAPAROSCOPIC BILATERAL SALPINGECTOMY N/A 03/11/2018   Procedure: OPERATIVE LAPAROSCOPY WITH EXTENSIVE LYSIS OF ADHESIONS;  Surgeon: Darcel Pool, MD;  Location: [facility];  Service: Gynecology;  Laterality: N/A;   LAPAROSCOPIC LYSIS OF ADHESIONS N/A 03/11/2018   Procedure: LAPAROSCOPIC LYSIS OF ADHESIONS;   Surgeon: Darcel Pool, MD;  Location: [facility];  Service: Gynecology;  Laterality: N/A;    Family History  Problem Relation Age of Onset   Hypertension Mother    Diabetes Mother    Heart attack Mother 4   Heart attack Father 72   Breast cancer Maternal Grandmother    Stomach cancer Maternal Grandfather    Colon cancer Neg Hx    Colon polyps Neg Hx    Esophageal cancer Neg Hx    Rectal cancer Neg Hx     Social History   Socioeconomic History   Marital status: Married    Spouse name: Not on file   Number of children: Not on file   Years of education: Not on file   Highest education level: Not on file  Occupational History   Not on file  Tobacco Use   Smoking status: Never   Smokeless tobacco: Never  Vaping Use   Vaping status: Never Used  Substance and Sexual Activity   Alcohol use: No   Drug use: No   Sexual activity: Yes    Birth control/protection: None  Other Topics Concern   Not on file  Social History Narrative   Not on file   Social Drivers of Health   Financial Resource Strain: Not on file  Food Insecurity: Not on file  Transportation Needs: Not on file  Physical Activity: Not on file  Stress: Not on file  Social Connections: Not on  file  Intimate Partner Violence: Not on file    Outpatient Medications Prior to Visit  Medication Sig Dispense Refill   acetaminophen  (TYLENOL ) 325 MG tablet Take 650 mg by mouth every 6 (six) hours as needed.     glucose blood (CONTOUR NEXT TEST) test strip Test once a day. Pt uses contour next one meter 100 each 1   meloxicam  (MOBIC ) 15 MG tablet Take 1 tablet (15 mg total) by mouth daily as needed for pain. 30 tablet 0   metFORMIN  (GLUCOPHAGE -XR) 500 MG 24 hr tablet TAKE 1 TABLET(500 MG) BY MOUTH DAILY WITH BREAKFAST 90 tablet 1   Microlet Lancets MISC Test once a day. Pt uses contour next one meter 100 each 1   rosuvastatin  (CRESTOR ) 10 MG tablet TAKE 1 TABLET(10 MG) BY MOUTH DAILY 90 tablet 1   tirzepatide   (MOUNJARO ) 5 MG/0.5ML Pen Inject 5 mg into the skin once a week. 2 mL 2   Facility-Administered Medications Prior to Visit  Medication Dose Route Frequency Provider Last Rate Last Admin   0.9 %  sodium chloride  infusion  500 mL Intravenous Once Teressa Toribio SQUIBB, MD        No Known Allergies  Review of Systems  Constitutional:  Negative for fever and malaise/fatigue.  HENT:  Positive for congestion and sinus pain.   Eyes:  Negative for blurred vision.  Respiratory:  Positive for cough and sputum production. Negative for shortness of breath.   Cardiovascular:  Negative for chest pain, palpitations and leg swelling.  Gastrointestinal:  Negative for vomiting.  Musculoskeletal:  Negative for back pain.  Skin:  Negative for rash.  Neurological:  Negative for loss of consciousness and headaches.       Objective:    Physical Exam Vitals and nursing note reviewed.  Constitutional:      General: She is not in acute distress.    Appearance: Normal appearance. She is well-developed.  HENT:     Head: Normocephalic and atraumatic.  Eyes:     General: No scleral icterus.       Right eye: No discharge.        Left eye: No discharge.  Cardiovascular:     Rate and Rhythm: Normal rate and regular rhythm.     Heart sounds: No murmur heard. Pulmonary:     Effort: Pulmonary effort is normal. No respiratory distress.     Breath sounds: Normal breath sounds.  Musculoskeletal:        General: Normal range of motion.     Cervical back: Normal range of motion and neck supple.     Right lower leg: No edema.     Left lower leg: No edema.  Skin:    General: Skin is warm and dry.  Neurological:     Mental Status: She is alert and oriented to person, place, and time.  Psychiatric:        Mood and Affect: Mood normal.        Behavior: Behavior normal.        Thought Content: Thought content normal.        Judgment: Judgment normal.     BP 102/70 (BP Location: Right Arm, Patient Position:  Sitting, Cuff Size: Large)   Pulse 72   Temp 98.6 F (37 C) (Oral)   Resp 18   Ht 5' 1 (1.549 m)   Wt 197 lb 6.4 oz (89.5 kg)   LMP 05/23/2014 (Approximate)   SpO2 96%   BMI 37.30 kg/m  Wt Readings from Last 3 Encounters:  10/19/24 197 lb 6.4 oz (89.5 kg)  10/02/24 202 lb (91.6 kg)  08/05/24 206 lb 12.8 oz (93.8 kg)    Diabetic Foot Exam - Simple   No data filed    Lab Results  Component Value Date   WBC 6.6 09/16/2020   HGB 11.3 09/16/2020   HCT 35.5 09/16/2020   PLT 357 09/16/2020   GLUCOSE 85 03/10/2024   CHOL 199 12/16/2023   TRIG 105.0 12/16/2023   HDL 43.70 12/16/2023   LDLCALC 134 (H) 12/16/2023   ALT 7 12/16/2023   AST 19 12/16/2023   NA 138 03/10/2024   K 3.9 03/10/2024   CL 102 03/10/2024   CREATININE 0.66 03/10/2024   BUN 11 03/10/2024   CO2 29 03/10/2024   TSH 0.998 10/26/2020   HGBA1C 6.4 (A) 07/14/2024   MICROALBUR 0.9 07/14/2024    Lab Results  Component Value Date   TSH 0.998 10/26/2020   Lab Results  Component Value Date   WBC 6.6 09/16/2020   HGB 11.3 09/16/2020   HCT 35.5 09/16/2020   MCV 82 09/16/2020   PLT 357 09/16/2020   Lab Results  Component Value Date   NA 138 03/10/2024   K 3.9 03/10/2024   CO2 29 03/10/2024   GLUCOSE 85 03/10/2024   BUN 11 03/10/2024   CREATININE 0.66 03/10/2024   BILITOT 0.5 12/16/2023   ALKPHOS 49 12/16/2023   AST 19 12/16/2023   ALT 7 12/16/2023   PROT 7.5 12/16/2023   ALBUMIN 3.9 12/16/2023   CALCIUM  9.7 03/10/2024   ANIONGAP 11 12/29/2016   GFR 103.23 03/10/2024   Lab Results  Component Value Date   CHOL 199 12/16/2023   Lab Results  Component Value Date   HDL 43.70 12/16/2023   Lab Results  Component Value Date   LDLCALC 134 (H) 12/16/2023   Lab Results  Component Value Date   TRIG 105.0 12/16/2023   Lab Results  Component Value Date   CHOLHDL 5 12/16/2023   Lab Results  Component Value Date   HGBA1C 6.4 (A) 07/14/2024       Assessment & Plan:  Acute non-recurrent  pansinusitis -     Amoxicillin -Pot Clavulanate; Take 1 tablet by mouth 2 (two) times daily.  Dispense: 20 tablet; Refill: 0 -     Promethazine -DM; Take 5 mLs by mouth 4 (four) times daily as needed.  Dispense: 118 mL; Refill: 0 -     Fluticasone  Propionate; Place 2 sprays into both nostrils daily.  Dispense: 16 g; Refill: 6  Assessment and Plan Assessment & Plan Acute pansinusitis   She has experienced congestion, thick mucus, a raspy cough, and sinus pressure for about a week and a half. No fever is reported. The initial sore throat has resolved, and headaches have subsided since Saturday. The cough persists, especially at night, causing sleep disturbance. There are no known allergies or adverse reactions to antibiotics and no history of easy yeast infections. Prescribe an antibiotic to be taken with food to prevent stomach upset. Recommend nasal spray for sinus pressure and prescribe cough syrup for nighttime use. Continue using Mucinex during the day.  Type 2 diabetes mellitus    Jamee JONELLE Antonio Cyndee, DO

## 2024-10-20 ENCOUNTER — Telehealth: Payer: Self-pay | Admitting: Orthopaedic Surgery

## 2024-10-20 NOTE — Telephone Encounter (Signed)
 Called patient to offer surgery date for left carpal tunnel release.  No answer.  Left message on patient's voicemail providing name and direct number for scheduling.

## 2024-10-20 NOTE — Telephone Encounter (Signed)
 Patient would like to schedule left carpal tunnel as close to the Christmas Holiday as possible so she is able to use the holiday time off as part of her recovery time.  She has chosen 11-12-24 as her surgery date but would like to get an idea of the recovery time line for planning purposes at work. 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  When will she be able to resume normal activity and what will her restrictions be?    Patient has a post op date of 11-25-24.  Please call patient at [phone].  It is ok to leave a message if she is unable to answer.

## 2024-11-04 ENCOUNTER — Other Ambulatory Visit: Payer: Self-pay | Admitting: Physician Assistant

## 2024-11-04 ENCOUNTER — Ambulatory Visit: Admitting: Internal Medicine

## 2024-11-04 MED ORDER — ONDANSETRON HCL 4 MG PO TABS: 4.0000 mg | ORAL_TABLET | Freq: Three times a day (TID) | ORAL | 0 refills | Status: AC | PRN

## 2024-11-04 MED ORDER — HYDROCODONE-ACETAMINOPHEN 5-325 MG PO TABS: 1.0000 | ORAL_TABLET | Freq: Three times a day (TID) | ORAL | 0 refills | Status: DC | PRN

## 2024-11-11 ENCOUNTER — Telehealth: Payer: Self-pay | Admitting: Orthopaedic Surgery

## 2024-11-11 NOTE — Telephone Encounter (Signed)
 Patient rescheduled left carpal tunnel release at [facility] from 11-12-24 to 12-31-24.

## 2024-11-25 ENCOUNTER — Encounter: Admitting: Physician Assistant

## 2024-11-30 DIAGNOSIS — E119 Type 2 diabetes mellitus without complications: Secondary | ICD-10-CM

## 2024-12-17 ENCOUNTER — Ambulatory Visit: Admitting: Internal Medicine

## 2024-12-17 ENCOUNTER — Encounter: Payer: Self-pay | Admitting: Internal Medicine

## 2024-12-17 VITALS — BP 118/80 | HR 75 | Temp 98.7°F | Ht 61.5 in | Wt 190.6 lb

## 2024-12-17 DIAGNOSIS — Z7984 Long term (current) use of oral hypoglycemic drugs: Secondary | ICD-10-CM

## 2024-12-17 DIAGNOSIS — Z7985 Long-term (current) use of injectable non-insulin antidiabetic drugs: Secondary | ICD-10-CM

## 2024-12-17 DIAGNOSIS — E1169 Type 2 diabetes mellitus with other specified complication: Secondary | ICD-10-CM | POA: Diagnosis not present

## 2024-12-17 DIAGNOSIS — Z1231 Encounter for screening mammogram for malignant neoplasm of breast: Secondary | ICD-10-CM

## 2024-12-17 DIAGNOSIS — E119 Type 2 diabetes mellitus without complications: Secondary | ICD-10-CM

## 2024-12-17 DIAGNOSIS — E785 Hyperlipidemia, unspecified: Secondary | ICD-10-CM

## 2024-12-17 LAB — POCT GLYCOSYLATED HEMOGLOBIN (HGB A1C): Hemoglobin A1C: 6.3 % — AB (ref 4.0–5.6)

## 2024-12-17 NOTE — Progress Notes (Signed)
 " [facility] [address] [place] [address] Dept: [phone] Dept Fax: [phone]    Subjective:   Anne Burton [birth year] 12/17/2024  Chief Complaint  Patient presents with   Follow-up    3 months no concerns things are good    HPI: Anne Burton presents today for re-assessment and management of chronic medical conditions.  Discussed the use of AI scribe software for clinical note transcription with the patient, who gave verbal consent to proceed.  History of Present Illness   Anne Burton is a 49 year old female who presents for a routine follow-up visit.  She has type 2 diabetes managed with metformin  500 mg once daily and Mounjaro  5 mg once weekly. Her last A1c was 6.3%; previously, it was 6.4%.  She also has hyperlipidemia associated with her diabetes and is taking rosuvastatin  10 mg once daily. Her cholesterol was last checked in January of the previous year and was slightly elevated.   She was scheduled for carpal tunnel surgery but had to reschedule it to February 17th due to financial constraints.   Lab Results  Component Value Date   HGBA1C 6.3 (A) 12/17/2024   HGBA1C 6.4 (A) 07/14/2024   HGBA1C 6.1 (A) 03/10/2024   Last lipids Lab Results  Component Value Date   CHOL 199 12/16/2023   HDL 43.70 12/16/2023   LDLCALC 134 (H) 12/16/2023   TRIG 105.0 12/16/2023   CHOLHDL 5 12/16/2023     The following portions of the patient's history were reviewed and updated as appropriate: past medical history, past surgical history, family history, social history, allergies, medications, and problem list.   Patient Active Problem List   Diagnosis Date Noted   Bilateral carpal tunnel syndrome 12/11/2023   Obesity (BMI 30-39.9) 11/11/2020   Hyperlipidemia associated with type 2 diabetes mellitus (HCC) 10/27/2020   Type 2 diabetes mellitus without complication, without long-term current use of insulin  (HCC)  10/27/2020   Plantar fasciitis 03/03/2018   S/P TAH (total abdominal hysterectomy) 02/10/2015   Uterine scar from previous cesarean delivery, antepartum 01/18/2015   Not immune to rubella 12/02/2014   Past Medical History:  Diagnosis Date   Allergy    Anemia    Hyperlipidemia associated with type 2 diabetes mellitus (HCC) 10/27/2020   Pelvic pain    hydrosalpynx   Past Surgical History:  Procedure Laterality Date   ABDOMINAL HYSTERECTOMY     done with 2016 C/S Surgery-general anesthesia   CESAREAN SECTION  1994,1997,2004,2016   x 4   LAPAROSCOPIC BILATERAL SALPINGECTOMY N/A 03/11/2018   Procedure: OPERATIVE LAPAROSCOPY WITH EXTENSIVE LYSIS OF ADHESIONS;  Surgeon: Darcel Pool, MD;  Location: [facility];  Service: Gynecology;  Laterality: N/A;   LAPAROSCOPIC LYSIS OF ADHESIONS N/A 03/11/2018   Procedure: LAPAROSCOPIC LYSIS OF ADHESIONS;  Surgeon: Darcel Pool, MD;  Location: [facility];  Service: Gynecology;  Laterality: N/A;   Family History  Problem Relation Age of Onset   Hypertension Mother    Diabetes Mother    Heart attack Mother 74   Heart attack Father 23   Breast cancer Maternal Grandmother    Stomach cancer Maternal Grandfather    Colon cancer Neg Hx    Colon polyps Neg Hx    Esophageal cancer Neg Hx    Rectal cancer Neg Hx    Current Medications[1] Allergies[2]   ROS: A complete ROS was performed with pertinent positives/negatives noted in the HPI. The remainder of the ROS are negative.  Objective:   Today's Vitals   12/17/24 1535  BP: 118/80  Pulse: 75  Temp: 98.7 F (37.1 C)  TempSrc: Temporal  SpO2: 98%  Weight: 190 lb 9.6 oz (86.5 kg)  Height: 5' 1.5 (1.562 m)    GENERAL: Well-appearing, in NAD. Well nourished.  SKIN: Pink, warm and dry.  NECK: Trachea midline. Full ROM w/o pain or tenderness. No lymphadenopathy.  RESPIRATORY: Chest wall symmetrical. Respirations even and non-labored. Breath sounds clear to auscultation bilaterally.  CARDIAC:  S1, S2 present, regular rate and rhythm. Peripheral pulses 2+ bilaterally.  EXTREMITIES: Without clubbing, cyanosis, or edema.  NEUROLOGIC:  Steady, even gait.  PSYCH/MENTAL STATUS: Alert, oriented x 3. Cooperative, appropriate mood and affect.   Health Maintenance Due  Topic Date Due   Mammogram  Never done    Results for orders placed or performed in visit on 12/17/24  POCT glycosylated hemoglobin (Hb A1C)  Result Value Ref Range   Hemoglobin A1C 6.3 (A) 4.0 - 5.6 %   HbA1c POC (<> result, manual entry)     HbA1c, POC (prediabetic range)     HbA1c, POC (controlled diabetic range)      The 10-year ASCVD risk score (Arnett DK, et al., 2019) is: 4.1%     Assessment & Plan:  Assessment and Plan    Type 2 diabetes mellitus Well-controlled with A1c at 6.3%, below target. - Continue metformin  500 mg once daily. - Continue Mounjaro  5 mg once weekly. - Scheduled follow-up in 6 months.  Hyperlipidemia associated with type 2 diabetes mellitus Managed with rosuvastatin . Last cholesterol check showed elevated levels. - Ordered fasting blood work for lipid panel, CMP, TSH - Continue rosuvastatin  10 mg once daily.  Breast cancer screening Mobile mammogram unit available. - Ordered mobile mammogram for February 16th       Orders Placed This Encounter  Procedures   MM 3D SCREENING MAMMOGRAM BILATERAL BREAST    Standing Status:   Future    Expiration Date:   12/17/2025    Reason for Exam (SYMPTOM  OR DIAGNOSIS REQUIRED):   screening for breast cancer    Preferred imaging location?:   [facility]    Is the patient pregnant?:   No   Comprehensive metabolic panel with GFR    Standing Status:   Future    Expiration Date:   06/16/2025   Lipid panel    Standing Status:   Future    Expiration Date:   06/16/2025   TSH    Standing Status:   Future    Expiration Date:   06/16/2025   POCT glycosylated hemoglobin (Hb A1C)   No images are attached to the encounter or orders placed  in the encounter. No orders of the defined types were placed in this encounter.   Return in about 6 months (around 06/16/2025) for Diabetes, Cholesterol.   [name], FNP     [1]  Current Outpatient Medications:    acetaminophen  (TYLENOL ) 325 MG tablet, Take 650 mg by mouth every 6 (six) hours as needed., Disp: , Rfl:    fluticasone  (FLONASE ) 50 MCG/ACT nasal spray, Place 2 sprays into both nostrils daily., Disp: 16 g, Rfl: 6   glucose blood (CONTOUR NEXT TEST) test strip, Test once a day. Pt uses contour next one meter, Disp: 100 each, Rfl: 1   HYDROcodone -acetaminophen  (NORCO/VICODIN) 5-325 MG tablet, Take 1 tablet by mouth 3 (three) times daily as needed. To be taken after surgery (Patient not taking: Reported on 12/17/2024), Disp:  15 tablet, Rfl: 0   meloxicam  (MOBIC ) 15 MG tablet, Take 1 tablet (15 mg total) by mouth daily as needed for pain., Disp: 30 tablet, Rfl: 0   metFORMIN  (GLUCOPHAGE -XR) 500 MG 24 hr tablet, TAKE 1 TABLET(500 MG) BY MOUTH DAILY WITH BREAKFAST, Disp: 90 tablet, Rfl: 1   Microlet Lancets MISC, Test once a day. Pt uses contour next one meter, Disp: 100 each, Rfl: 1   MOUNJARO  5 MG/0.5ML Pen, ADMINISTER 5 MG UNDER THE SKIN 1 TIME A WEEK, Disp: 2 mL, Rfl: 2   ondansetron  (ZOFRAN ) 4 MG tablet, Take 1 tablet (4 mg total) by mouth every 8 (eight) hours as needed for nausea or vomiting. (Patient not taking: Reported on 12/17/2024), Disp: 40 tablet, Rfl: 0   rosuvastatin  (CRESTOR ) 10 MG tablet, TAKE 1 TABLET(10 MG) BY MOUTH DAILY, Disp: 90 tablet, Rfl: 1  Current Facility-Administered Medications:    0.9 %  sodium chloride  infusion, 500 mL, Intravenous, Once, Teressa Toribio SQUIBB, MD [2] No Known Allergies  "

## 2024-12-28 ENCOUNTER — Other Ambulatory Visit

## 2024-12-29 ENCOUNTER — Other Ambulatory Visit: Payer: Self-pay | Admitting: Physician Assistant

## 2024-12-29 MED ORDER — HYDROCODONE-ACETAMINOPHEN 5-325 MG PO TABS: 1.0000 | ORAL_TABLET | Freq: Three times a day (TID) | ORAL | 0 refills | Status: AC | PRN

## 2025-01-01 ENCOUNTER — Ambulatory Visit: Payer: Self-pay | Admitting: Internal Medicine

## 2025-01-01 ENCOUNTER — Other Ambulatory Visit

## 2025-01-01 DIAGNOSIS — E1169 Type 2 diabetes mellitus with other specified complication: Secondary | ICD-10-CM

## 2025-01-01 DIAGNOSIS — E119 Type 2 diabetes mellitus without complications: Secondary | ICD-10-CM

## 2025-01-01 LAB — COMPREHENSIVE METABOLIC PANEL WITH GFR
ALT: 6 U/L (ref 3–35)
AST: 18 U/L (ref 5–37)
Albumin: 4 g/dL (ref 3.5–5.2)
Alkaline Phosphatase: 58 U/L (ref 39–117)
BUN: 10 mg/dL (ref 6–23)
CO2: 28 meq/L (ref 19–32)
Calcium: 9.5 mg/dL (ref 8.4–10.5)
Chloride: 106 meq/L (ref 96–112)
Creatinine, Ser: 0.66 mg/dL (ref 0.40–1.20)
GFR: 102.65 mL/min
Glucose, Bld: 98 mg/dL (ref 70–99)
Potassium: 4 meq/L (ref 3.5–5.1)
Sodium: 141 meq/L (ref 135–145)
Total Bilirubin: 0.4 mg/dL (ref 0.2–1.2)
Total Protein: 7.7 g/dL (ref 6.0–8.3)

## 2025-01-01 LAB — TSH: TSH: 0.78 u[IU]/mL (ref 0.35–5.50)

## 2025-01-01 LAB — LIPID PANEL
Cholesterol: 155 mg/dL (ref 28–200)
HDL: 44.2 mg/dL
LDL Cholesterol: 97 mg/dL (ref 10–99)
NonHDL: 110.35
Total CHOL/HDL Ratio: 3
Triglycerides: 69 mg/dL (ref 10.0–149.0)
VLDL: 13.8 mg/dL (ref 0.0–40.0)

## 2025-01-01 NOTE — Progress Notes (Signed)
 Hi Anne Burton,  Good news, your thyroid  level, electrolytes, kidney function are all within normal range.  Also, your cholesterol has significantly improved from last year.  Please continue taking your rosuvastatin .  [name]

## 2025-01-11 ENCOUNTER — Encounter

## 2025-01-12 ENCOUNTER — Encounter: Admitting: Physician Assistant

## 2025-02-02 ENCOUNTER — Encounter: Admitting: Physician Assistant

## 2025-06-16 ENCOUNTER — Ambulatory Visit: Admitting: Internal Medicine
